# Patient Record
Sex: Female | Born: 1940 | Race: White | Hispanic: No | Smoking: Never smoker
Health system: Southern US, Community
[De-identification: ages and names within clinical notes are randomized; demographics above are authoritative.]

## PROBLEM LIST (undated history)

## (undated) DIAGNOSIS — J189 Pneumonia, unspecified organism: Secondary | ICD-10-CM

## (undated) DIAGNOSIS — J45909 Unspecified asthma, uncomplicated: Secondary | ICD-10-CM

## (undated) DIAGNOSIS — K219 Gastro-esophageal reflux disease without esophagitis: Secondary | ICD-10-CM

## (undated) DIAGNOSIS — R519 Headache, unspecified: Secondary | ICD-10-CM

## (undated) DIAGNOSIS — I10 Essential (primary) hypertension: Secondary | ICD-10-CM

## (undated) DIAGNOSIS — E079 Disorder of thyroid, unspecified: Secondary | ICD-10-CM

## (undated) DIAGNOSIS — D649 Anemia, unspecified: Secondary | ICD-10-CM

## (undated) DIAGNOSIS — M1990 Unspecified osteoarthritis, unspecified site: Secondary | ICD-10-CM

## (undated) DIAGNOSIS — L509 Urticaria, unspecified: Secondary | ICD-10-CM

## (undated) DIAGNOSIS — N189 Chronic kidney disease, unspecified: Secondary | ICD-10-CM

## (undated) DIAGNOSIS — F32A Depression, unspecified: Secondary | ICD-10-CM

## (undated) DIAGNOSIS — T783XXA Angioneurotic edema, initial encounter: Secondary | ICD-10-CM

## (undated) DIAGNOSIS — E039 Hypothyroidism, unspecified: Secondary | ICD-10-CM

## (undated) DIAGNOSIS — F419 Anxiety disorder, unspecified: Secondary | ICD-10-CM

## (undated) DIAGNOSIS — K279 Peptic ulcer, site unspecified, unspecified as acute or chronic, without hemorrhage or perforation: Secondary | ICD-10-CM

## (undated) HISTORY — DX: Angioneurotic edema, initial encounter: T78.3XXA

## (undated) HISTORY — DX: Peptic ulcer, site unspecified, unspecified as acute or chronic, without hemorrhage or perforation: K27.9

## (undated) HISTORY — PX: SINOSCOPY: SHX187

## (undated) HISTORY — DX: Urticaria, unspecified: L50.9

## (undated) HISTORY — PX: ABDOMINAL HYSTERECTOMY: SHX81

## (undated) HISTORY — PX: CATARACT EXTRACTION: SUR2

---

## 2011-03-16 DIAGNOSIS — E039 Hypothyroidism, unspecified: Secondary | ICD-10-CM | POA: Diagnosis present

## 2019-01-14 ENCOUNTER — Other Ambulatory Visit: Payer: Self-pay

## 2019-01-14 ENCOUNTER — Emergency Department (HOSPITAL_COMMUNITY): Payer: Medicare Other

## 2019-01-14 ENCOUNTER — Emergency Department (HOSPITAL_COMMUNITY)
Admission: EM | Admit: 2019-01-14 | Discharge: 2019-01-14 | Disposition: A | Discharge status: Home / Self Care | Payer: Medicare Other | Attending: Emergency Medicine | Admitting: Emergency Medicine

## 2019-01-14 ENCOUNTER — Encounter (HOSPITAL_COMMUNITY): Payer: Self-pay | Admitting: Emergency Medicine

## 2019-01-14 DIAGNOSIS — Y939 Activity, unspecified: Secondary | ICD-10-CM | POA: Insufficient documentation

## 2019-01-14 DIAGNOSIS — Z23 Encounter for immunization: Secondary | ICD-10-CM | POA: Diagnosis not present

## 2019-01-14 DIAGNOSIS — W01198A Fall on same level from slipping, tripping and stumbling with subsequent striking against other object, initial encounter: Secondary | ICD-10-CM | POA: Insufficient documentation

## 2019-01-14 DIAGNOSIS — J45909 Unspecified asthma, uncomplicated: Secondary | ICD-10-CM | POA: Diagnosis not present

## 2019-01-14 DIAGNOSIS — S50811A Abrasion of right forearm, initial encounter: Secondary | ICD-10-CM | POA: Insufficient documentation

## 2019-01-14 DIAGNOSIS — S0990XA Unspecified injury of head, initial encounter: Secondary | ICD-10-CM | POA: Diagnosis present

## 2019-01-14 DIAGNOSIS — S80211A Abrasion, right knee, initial encounter: Secondary | ICD-10-CM | POA: Diagnosis not present

## 2019-01-14 DIAGNOSIS — Y929 Unspecified place or not applicable: Secondary | ICD-10-CM | POA: Insufficient documentation

## 2019-01-14 DIAGNOSIS — I10 Essential (primary) hypertension: Secondary | ICD-10-CM | POA: Diagnosis not present

## 2019-01-14 DIAGNOSIS — Y999 Unspecified external cause status: Secondary | ICD-10-CM | POA: Insufficient documentation

## 2019-01-14 HISTORY — DX: Essential (primary) hypertension: I10

## 2019-01-14 HISTORY — DX: Unspecified asthma, uncomplicated: J45.909

## 2019-01-14 HISTORY — DX: Disorder of thyroid, unspecified: E07.9

## 2019-01-14 MED ORDER — FENTANYL CITRATE (PF) 100 MCG/2ML IJ SOLN
100.0000 ug | Freq: Once | INTRAMUSCULAR | Status: AC
  Administered 2019-01-14: 100 ug via INTRAVENOUS
  Filled 2019-01-14: qty 2

## 2019-01-14 MED ORDER — ONDANSETRON HCL 4 MG/2ML IJ SOLN
4.0000 mg | Freq: Once | INTRAMUSCULAR | Status: AC
  Administered 2019-01-14: 4 mg via INTRAVENOUS
  Filled 2019-01-14: qty 2

## 2019-01-14 MED ORDER — TETANUS-DIPHTH-ACELL PERTUSSIS 5-2.5-18.5 LF-MCG/0.5 IM SUSP
0.5000 mL | Freq: Once | INTRAMUSCULAR | Status: AC
  Administered 2019-01-14: 0.5 mL via INTRAMUSCULAR
  Filled 2019-01-14: qty 0.5

## 2019-01-14 NOTE — ED Provider Notes (Signed)
[facility]-EMERGENCY DEPT Provider Note   CSN: [id number] Arrival date & time: 01/14/19  1156    History   Chief Complaint Chief Complaint  Patient presents with  . Fall  . Headache  . Head Injury    HPI Kevia K Jomarie LongsJoseph is a 78 y.o. female.     HPI 78 year old female on no anticoagulants who presents the emergency department with her closed head injury.  She states she slipped and fell and struck her left forehead on a brick pillar.  She scraped her right knee as well.  She also scraped her anterior distal right forearm.  No pain with range of motion of her major joints.  Ambulatory.  Nausea and headache at this time.  Mild neck pain without weakness of her arms or legs.  No chest pain.  No abdominal pain.  Symptoms are moderate in severity.  She is vomiting on my arrival to the room.  Hematoma to the left forehead noted with associated abrasion   Past Medical History:  Diagnosis Date  . Asthma   . Hypertension   . Thyroid disease     There are no active problems to display for this patient.   Past Surgical History:  Procedure Laterality Date  . ABDOMINAL HYSTERECTOMY       OB History   No obstetric history on file.      Home Medications    Prior to Admission medications   Not on File    Family History No family history on file.  Social History Social History   Tobacco Use  . Smoking status: Never Smoker  Substance Use Topics  . Alcohol use: Not Currently  . Drug use: Not on file     Allergies   Morphine and related; Other; Propofol; and Sulfa antibiotics   Review of Systems Review of Systems  All other systems reviewed and are negative.    Physical Exam Updated Vital Signs BP (!) 149/89   Pulse 78   Temp (!) 97 F (36.1 C) (Axillary)   Resp 20   SpO2 95%   Physical Exam Vitals signs and nursing note reviewed.  Constitutional:      General: She is not in acute distress.    Appearance: She is well-developed.   HENT:     Head: Normocephalic.     Comments: Left forehead hematoma with associated abrasion.  No laceration.  No active bleeding. Neck:     Musculoskeletal: Normal range of motion.     Comments: Mild cervical and paracervical tenderness without cervical step-off. Cardiovascular:     Rate and Rhythm: Normal rate and regular rhythm.     Heart sounds: Normal heart sounds.  Pulmonary:     Effort: Pulmonary effort is normal.     Breath sounds: Normal breath sounds.  Abdominal:     General: There is no distension.     Palpations: Abdomen is soft.     Tenderness: There is no abdominal tenderness.  Musculoskeletal: Normal range of motion.     Comments: Abrasion right anterior forearm.  Abrasion right anterior knee.  Full range of motion of bilateral hips, knees, ankles.  Full range of motion of bilateral shoulders, elbows, wrist.  Skin:    General: Skin is warm and dry.  Neurological:     Mental Status: She is alert and oriented to person, place, and time.  Psychiatric:        Judgment: Judgment normal.      ED Treatments / Results  Labs (all labs ordered are listed, but only abnormal results are displayed) Labs Reviewed - No data to display  EKG None  Radiology Ct Head Wo Contrast  Result Date: 01/14/2019 CLINICAL DATA:  Larey Seat EXAM: CT HEAD WITHOUT CONTRAST CT CERVICAL SPINE WITHOUT CONTRAST TECHNIQUE: Multidetector CT imaging of the head and cervical spine was performed following the standard protocol without intravenous contrast. Multiplanar CT image reconstructions of the cervical spine were also generated. COMPARISON:  None. FINDINGS: CT HEAD FINDINGS Brain: Diffuse parenchymal atrophy. Patchy areas of hypoattenuation in deep and periventricular white matter bilaterally. Negative for acute intracranial hemorrhage, mass lesion, acute infarction, midline shift, or mass-effect. Acute infarct may be inapparent on noncontrast CT. Ventricles and sulci symmetric. Vascular: No hyperdense  vessel or unexpected calcification. Skull: Normal. Negative for fracture or focal lesion. Sinuses/Orbits: No acute finding. Other: Left frontal scalp hematoma. CT CERVICAL SPINE FINDINGS Alignment: Normal with straightening of the lordosis Skull base and vertebrae: Negative for fracture focal bone lesion. Soft tissues and spinal canal: No prevertebral fluid or swelling. No visible canal hematoma. Disc levels: Right facet DJD C4-5 with 2 mm anterolisthesis and mild interspace narrowing. Moderate narrowing of C5-6 and C6-7 interspaces with small endplate spurs. Upper chest: Negative. Other: None IMPRESSION: 1. Negative for bleed or other acute intracranial process. 2. Atrophy and nonspecific white matter changes. 3. Negative for cervical fracture or other acute bone abnormality. 4. Multilevel cervical spondylitic changes as above. 5. Loss of the normal cervical spine lordosis, which may be secondary to positioning, spasm, or soft tissue injury. Electronically Signed   By: Corlis Leak M.D.   On: 01/14/2019 14:44   Ct Cervical Spine Wo Contrast  Result Date: 01/14/2019 CLINICAL DATA:  Larey Seat EXAM: CT HEAD WITHOUT CONTRAST CT CERVICAL SPINE WITHOUT CONTRAST TECHNIQUE: Multidetector CT imaging of the head and cervical spine was performed following the standard protocol without intravenous contrast. Multiplanar CT image reconstructions of the cervical spine were also generated. COMPARISON:  None. FINDINGS: CT HEAD FINDINGS Brain: Diffuse parenchymal atrophy. Patchy areas of hypoattenuation in deep and periventricular white matter bilaterally. Negative for acute intracranial hemorrhage, mass lesion, acute infarction, midline shift, or mass-effect. Acute infarct may be inapparent on noncontrast CT. Ventricles and sulci symmetric. Vascular: No hyperdense vessel or unexpected calcification. Skull: Normal. Negative for fracture or focal lesion. Sinuses/Orbits: No acute finding. Other: Left frontal scalp hematoma. CT CERVICAL  SPINE FINDINGS Alignment: Normal with straightening of the lordosis Skull base and vertebrae: Negative for fracture focal bone lesion. Soft tissues and spinal canal: No prevertebral fluid or swelling. No visible canal hematoma. Disc levels: Right facet DJD C4-5 with 2 mm anterolisthesis and mild interspace narrowing. Moderate narrowing of C5-6 and C6-7 interspaces with small endplate spurs. Upper chest: Negative. Other: None IMPRESSION: 1. Negative for bleed or other acute intracranial process. 2. Atrophy and nonspecific white matter changes. 3. Negative for cervical fracture or other acute bone abnormality. 4. Multilevel cervical spondylitic changes as above. 5. Loss of the normal cervical spine lordosis, which may be secondary to positioning, spasm, or soft tissue injury. Electronically Signed   By: Corlis Leak M.D.   On: 01/14/2019 14:44    Procedures Procedures (including critical care time)  Medications Ordered in ED Medications  ondansetron (ZOFRAN) injection 4 mg (4 mg Intravenous Given 01/14/19 1315)  fentaNYL (SUBLIMAZE) injection 100 mcg (100 mcg Intravenous Given 01/14/19 1315)  Tdap (BOOSTRIX) injection 0.5 mL (0.5 mLs Intramuscular Given 01/14/19 1316)     Initial Impression / Assessment and Plan /  ED Course  I have reviewed the triage vital signs and the nursing notes.  Pertinent labs & imaging results that were available during my care of the patient were reviewed by me and considered in my medical decision making (see chart for details).        CT imaging of the head and neck without acute traumatic findings.  Abrasions to the right forearm and right knee with full range of motion of major surrounding joints.  No indication for plain films.  Feels much better after treatment here in the emergency department.  Close head injury warnings given.  Discussed discharge instructions with her grandson Nida Boatman including instructions regarding delayed head bleed and close observation at home.  The  patient is currently staying with her grandson here in [place].   Final Clinical Impressions(s) / ED Diagnoses   Final diagnoses:  Closed head injury, initial encounter    ED Discharge Orders    None       Azalia Bilis, MD 01/14/19 1549

## 2019-01-14 NOTE — ED Notes (Signed)
Bed: WA02 Expected date:  Expected time:  Means of arrival:  Comments: 

## 2019-01-14 NOTE — ED Triage Notes (Signed)
Pt fell outside of grocery store hitting head and arm on brick column. Pt with contusion and swelling to L forehead, abrasion to R forearm and R knee. Pt denies LOC. Pt also c/o nausea / vomiting since the fall.

## 2019-11-08 DIAGNOSIS — E785 Hyperlipidemia, unspecified: Secondary | ICD-10-CM | POA: Insufficient documentation

## 2019-11-08 DIAGNOSIS — I10 Essential (primary) hypertension: Secondary | ICD-10-CM | POA: Diagnosis present

## 2019-11-08 DIAGNOSIS — G4733 Obstructive sleep apnea (adult) (pediatric): Secondary | ICD-10-CM | POA: Diagnosis present

## 2019-11-16 ENCOUNTER — Ambulatory Visit (INDEPENDENT_AMBULATORY_CARE_PROVIDER_SITE_OTHER): Payer: Medicare Other

## 2019-11-16 ENCOUNTER — Encounter: Payer: Self-pay | Admitting: Internal Medicine

## 2019-11-16 ENCOUNTER — Ambulatory Visit: Payer: Medicare Other | Admitting: Internal Medicine

## 2019-11-16 ENCOUNTER — Other Ambulatory Visit: Payer: Self-pay

## 2019-11-16 DIAGNOSIS — J45991 Cough variant asthma: Secondary | ICD-10-CM | POA: Insufficient documentation

## 2019-11-16 MED ORDER — BUDESONIDE-FORMOTEROL FUMARATE 80-4.5 MCG/ACT IN AERO: INHALATION_SPRAY | RESPIRATORY_TRACT | 12 refills | Status: DC

## 2019-11-16 MED ORDER — FAMOTIDINE 20 MG PO TABS: ORAL_TABLET | ORAL | 11 refills | Status: DC

## 2019-11-16 MED ORDER — PREDNISONE 10 MG PO TABS: ORAL_TABLET | ORAL | 5 refills | Status: DC

## 2019-11-16 NOTE — Progress Notes (Signed)
Jeanne Ortiz, female    DOB: 1941-06-30    MRN: [id number]   Brief patient profile:  79 yowf never smoker onset in teenager years in [place] with intermittent non-seasonal rhinitis more problem around age 79 with year round symptoms no better with sinus surgeries / allergy eval pos birch, horses cats/dog never took shots  with new onset cough/wheezing  around 2010 eval by [facility] rx alb/antihistamines then he left for [facility]  and then seen  By [facility]  rec gerd rx didn't help and side effects = crampy  then nucala did not take due to cost and worse cough/sob since arrived in [facility] March 2020 so referred to pulmonary clinic 11/16/2019 by [name]  NP.  Living in house built in 1980s with house with crawl space/ 2 cats / golden retriever    History of Present Illness  11/16/2019  Pulmonary/ 1st office eval/Jeanne Ortiz / maint on singulair and generic advair Chief Complaint  Patient presents with  . Pulmonary Consult    Referred by [name], NP. Pt c/o cough for the past 8-10 years.   Dyspnea:  MMRC2 = can't walk a nl pace on a flat grade s sob but does fine slow and flat leaning on cart at HT Cough: dry raspy after supper and hs  Sleep: flat bed 2 pillows  SABA use: daily now but up to 7x per day much less since last prednisone finished  4 days  prior to OV   Has overt HB but told can't take ppi due to renal dz  No obvious day to day or daytime variability or assoc excess/ purulent sputum or mucus plugs or hemoptysis or cp or chest tightness, subjective wheeze or overt sinus or hb symptoms.    Also denies any obvious fluctuation of symptoms with weather or environmental changes or other aggravating or alleviating factors except as outlined above   No unusual exposure hx or h/o childhood pna/ asthma or knowledge of premature birth.  Current Allergies, Complete Past Medical History, Past Surgical History, Family History, and Social History were  reviewed in [employer] record.  ROS  The following are not active complaints unless bolded Hoarseness, sore throat, dysphagia, dental problems, itching, sneezing,  nasal congestion or discharge of excess mucus or purulent secretions, ear ache,   fever, chills, sweats, unintended wt loss or wt gain, classically pleuritic or exertional cp,  orthopnea pnd or arm/hand swelling  or leg swelling, presyncope, palpitations, abdominal pain, anorexia, nausea, vomiting, diarrhea  or change in bowel habits or change in bladder habits, change in stools or change in urine, dysuria, hematuria,  rash, arthralgias, visual complaints, headache, numbness, weakness or ataxia or problems with walking or coordination,  change in mood or  memory.           Past Medical History:  Diagnosis Date  . Asthma   . Hypertension   . Thyroid disease     Outpatient Medications Prior to Visit  Medication Sig Dispense Refill  . Albuterol Sulfate (PROAIR HFA IN) Inhale 2 puffs into the lungs every 4 (four) hours as needed.    Marland Kitchen azelastine (ASTELIN) 0.1 % nasal spray Place into both nostrils 2 (two) times daily. Use in each nostril as directed    . b complex vitamins tablet Take 1 tablet by mouth daily.    . cholecalciferol (VITAMIN D3) 25 MCG (1000 UNIT) tablet Take 1,000 Units by mouth daily.    . Coenzyme Q10  200 MG capsule Take 200 mg by mouth daily.    . Docosahexaenoic Acid (DHA) 200 MG CAPS Take by mouth.    . fexofenadine (ALLEGRA) 180 MG tablet Take 180 mg by mouth daily.    . Fluticasone-Salmeterol (WIXELA INHUB) 100-50 MCG/DOSE AEPB Inhale 1 puff into the lungs 2 (two) times daily as needed.    Marland Kitchen guaiFENesin (MUCINEX) 600 MG 12 hr tablet Take 600 mg by mouth 2 (two) times daily as needed.    Marland Kitchen ibuprofen (ADVIL) 200 MG tablet Take 200 mg by mouth every 6 (six) hours as needed.    Marland Kitchen ipratropium (ATROVENT) 0.02 % nebulizer solution Take 0.5 mg by nebulization every 4 (four) hours as needed for  wheezing or shortness of breath.    . levothyroxine (SYNTHROID) 112 MCG tablet Take 112 mcg by mouth daily before breakfast.    . liraglutide (VICTOZA) 18 MG/3ML SOPN Inject into the skin.    Marland Kitchen LORazepam (ATIVAN) 0.5 MG tablet Take 0.5-1 mg by mouth at bedtime.    Marland Kitchen losartan (COZAAR) 50 MG tablet Take 50 mg by mouth daily.    . magnesium oxide (MAG-OX) 400 MG tablet Take 400 mg by mouth daily.    . montelukast (SINGULAIR) 10 MG tablet Take 10 mg by mouth at bedtime.    . Multiple Vitamins-Minerals (ZINC PO) Take 1 tablet by mouth daily.    . naproxen sodium (ALEVE) 220 MG tablet Take 220 mg by mouth daily as needed.    . pseudoephedrine (SUDAFED) 30 MG tablet Take 30 mg by mouth every 4 (four) hours as needed for congestion.    . simethicone (MYLICON) 643 MG chewable tablet Chew 125 mg by mouth every 6 (six) hours as needed for flatulence.    . sodium chloride (OCEAN) 0.65 % SOLN nasal spray Place 1 spray into both nostrils as needed for congestion.    Marland Kitchen spironolactone (ALDACTONE) 25 MG tablet Take 25 mg by mouth daily.    Marland Kitchen venlafaxine (EFFEXOR) 25 MG tablet Take 25 mg by mouth daily.    . vitamin E (VITAMIN E) 180 MG (400 UNITS) capsule Take 400 Units by mouth daily.        Objective:     BP (!) 114/56 (BP Location: Left Arm, Cuff Size: Normal)   Pulse 81   Temp 98.7 F (37.1 C) (Temporal)   Ht 5' 2.5" (1.588 m)   Wt 220 lb (99.8 kg)   SpO2 96% Comment: on RA  BMI 39.60 kg/m   SpO2: 96 %(on RA)   amb obese wf  nad    HEENT : pt wearing mask not removed for exam due to covid -19 concerns.    NECK :  without JVD/Nodes/TM/ nl carotid upstrokes bilaterally   LUNGS: no acc muscle use,  Nl contour chest which is clear to A and P bilaterally     CV:  RRR  no s3 or murmur or increase in P2, and no edema   ABD:  Obese soft and nontender with nl inspiratory excursion in the supine position. No bruits or organomegaly appreciated, bowel sounds nl  MS:  Nl gait/ ext warm without  deformities, calf tenderness, cyanosis or clubbing No obvious joint restrictions   SKIN: warm and dry without lesions    NEURO:  alert, approp, nl sensorium with  no motor or cerebellar deficits apparent.    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 No acute cardiopulmonary abnormality.      Assessment   Cough variant asthma Onset around 2010 in pt with allergic rhinitis - 11/16/2019  After extensive coaching inhaler device,  effectiveness =    75% > try symbicort 80 2bid instead of wixella (DPI)  DDX of  difficult airways management almost all start with A and  include Adherence, Ace Inhibitors, Acid Reflux, Active Sinus Disease, Alpha 1 Antitripsin deficiency, Anxiety masquerading as Airways dz,  ABPA,  Allergy(esp in young), Aspiration (esp in elderly), Adverse effects of meds,  Active smoking or vaping, A bunch of PE's (a small clot burden can't cause this syndrome unless there is already severe underlying pulm or vascular dz with poor reserve) plus two Bs  = Bronchiectasis and Beta blocker use..and one C= CHF   Adherence is always the initial "prime suspect" and is a multilayered concern that requires a "trust but verify" approach in every patient - starting with knowing how to use medications, especially inhalers, correctly, keeping up with refills and understanding the fundamental difference between maintenance and prns vs those medications only taken for a very short course and then stopped and not refilled.  - see hfa teaching  - return with all meds in hand using a trust but verify approach to confirm accurate Medication  Reconciliation The principal here is that until we are certain that the  patients are doing what we've asked, it makes no sense to ask them to do more.   ? Allergy > just  finished pred so hold off on profile for now and should do fine on singulair/ low dose symbicort but use pred as plan D   ? ABPA > check IgE   ? Acid (or non-acid) GERD > always difficult to exclude as up to 75% of pts in some series report no assoc GI/ Heartburn symptoms> rec pepcid 20 mg bid  suppression and diet restrictions/ reviewed and instructions given in writing.   ? Adverse effects of meds > d/c dpi which tend to cause more cough than hfa  ? Active sinus dz > s/p 2 "unsuccessful" sinus surgeries so keep in ddz/ consider sinus CT if not responding to above rx   I had an extended discussion with the patient and reviewed all relevant studies so total time was 60  Minutes for visit/ charting with high  level of MDM to address chronic refractory symptoms and teach hfa using teach back method  Each maintenance medication was reviewed in detail including most importantly the difference between maintenance and prns and under what circumstances the prns are to be triggered using an action plan format that is not reflected in the computer generated alphabetically organized AVS.     Please see AVS for specific instructions unique to this visit that I personally wrote and verbalized to the the pt in detail and then reviewed with pt  by my nurse highlighting any  changes in therapy recommended at today's visit to their plan of care.       Sandrea Hughs, MD 11/16/2019

## 2019-11-16 NOTE — Patient Instructions (Addendum)
.  Plan A = Automatic = Always=    Symbicort 80 Take 2 puffs first thing in am and then another 2 puffs about 12 hours later.   Work on inhaler technique:  relax and gently blow all the way out then take a nice smooth deep breath back in, triggering the inhaler at same time you start breathing in.  Hold for up to 5 seconds if you can. Blow out thru nose. Rinse and gargle with water when done  Pepcid 20 mg one after bfast and supper   Plan B = Backup (to supplement plan A, not to replace it) Only use your albuterol(poair)  inhaler as a rescue medication to be used if you can't catch your breath by resting or doing a relaxed purse lip breathing pattern.  - The less you use it, the better it will work when you need it. - Ok to use the inhaler up to 2 puffs  every 4 hours if you must but call for appointment if use goes up over your usual need - Don't leave home without it !!  (think of it like the spare tire for your car)   Plan C = Crisis (instead of Plan B but only if Plan B stops working) - only use your albuterol nebulizer if you first try Plan B and it fails to help > ok to use the nebulizer up to every 4 hours but if start needing it regularly call for immediate appointment   Plan D = Deltasone - if getting worse despite above then take Prednisone 10 mg take  4 each am x 2 days,   2 each am x 2 days,  1 each am x 2 days and stop    Please remember to go to the  x-ray department  for your tests - we will call you with the results when they are available    Please schedule a follow up office visit in 6 weeks, call sooner if needed

## 2019-11-17 ENCOUNTER — Encounter: Payer: Self-pay | Admitting: Internal Medicine

## 2019-11-17 NOTE — Assessment & Plan Note (Addendum)
Onset around 2010 in pt with allergic rhinitis - 11/16/2019  After extensive coaching inhaler device,  effectiveness =    75% > try symbicort 80 2bid instead of wixella (DPI)  DDX of  difficult airways management almost all start with A and  include Adherence, Ace Inhibitors, Acid Reflux, Active Sinus Disease, Alpha 1 Antitripsin deficiency, Anxiety masquerading as Airways dz,  ABPA,  Allergy(esp in young), Aspiration (esp in elderly), Adverse effects of meds,  Active smoking or vaping, A bunch of PE's (a small clot burden can't cause this syndrome unless there is already severe underlying pulm or vascular dz with poor reserve) plus two Bs  = Bronchiectasis and Beta blocker use..and one C= CHF   Adherence is always the initial "prime suspect" and is a multilayered concern that requires a "trust but verify" approach in every patient - starting with knowing how to use medications, especially inhalers, correctly, keeping up with refills and understanding the fundamental difference between maintenance and prns vs those medications only taken for a very short course and then stopped and not refilled.  - see hfa teaching  - return with all meds in hand using a trust but verify approach to confirm accurate Medication  Reconciliation The principal here is that until we are certain that the  patients are doing what we've asked, it makes no sense to ask them to do more.   ? Allergy > just finished pred so hold off on profile for now and should do fine on singulair/ low dose symbicort but use pred as plan D   ? ABPA > check IgE   ? Acid (or non-acid) GERD > always difficult to exclude as up to 75% of pts in some series report no assoc GI/ Heartburn symptoms> rec pepcid 20 mg bid  suppression and diet restrictions/ reviewed and instructions given in writing.   ? Adverse effects of meds > d/c dpi which tend to cause more cough than hfa  ? Active sinus dz > s/p 2 "unsuccessful" sinus surgeries so keep in ddz/  consider sinus CT if not responding to above rx   I had an extended discussion with the patient and reviewed all relevant studies so total time was 60  Minutes for visit/ charting with high  level of MDM to address chronic refractory symptoms and teach hfa using teach back method  Each maintenance medication was reviewed in detail including most importantly the difference between maintenance and prns and under what circumstances the prns are to be triggered using an action plan format that is not reflected in the computer generated alphabetically organized AVS.     Please see AVS for specific instructions unique to this visit that I personally wrote and verbalized to the the pt in detail and then reviewed with pt  by my nurse highlighting any  changes in therapy recommended at today's visit to their plan of care.

## 2019-12-28 ENCOUNTER — Ambulatory Visit: Payer: Medicare Other | Admitting: Internal Medicine

## 2020-01-26 ENCOUNTER — Ambulatory Visit: Payer: Medicare Other | Admitting: Internal Medicine

## 2020-01-26 ENCOUNTER — Other Ambulatory Visit: Payer: Self-pay

## 2020-01-26 ENCOUNTER — Encounter: Payer: Self-pay | Admitting: Internal Medicine

## 2020-01-26 VITALS — BP 118/68 | HR 72 | Temp 98.3°F | Ht 62.5 in | Wt 217.0 lb

## 2020-01-26 DIAGNOSIS — J45991 Cough variant asthma: Secondary | ICD-10-CM

## 2020-01-26 NOTE — Patient Instructions (Signed)
No change in therapy    Please schedule a follow up visit in 6 months but call sooner if needed with PFTs on return

## 2020-01-26 NOTE — Progress Notes (Signed)
Jeanne Ortiz, female    DOB: 07-23-1941    MRN: [id number]   Brief patient profile:  79 yowf never smoker onset in teenager years in [place] with intermittent non-seasonal rhinitis more problem around age 79 with year round symptoms no better with sinus surgeries / allergy eval pos birch, horses cats/dog never took shots  with new onset cough/wheezing  around 2010 eval by [facility] rx alb/antihistamines then he left for [place] [facility]  and then seen  By [facility]  rec gerd rx didn't help and side effects = crampy  then nucala did not take due to cost and worse cough/sob since arrived in [facility] March 2020 so referred to pulmonary clinic 11/16/2019 by [name]  NP.  Living in house built in 1980s with house with crawl space/ 2 cats / golden retriever    History of Present Illness  11/16/2019  Pulmonary/ 1st office eval/Jeanne Ortiz / maint on singulair and generic advair Chief Complaint  Patient presents with  . Pulmonary Consult    Referred by [name], NP. Pt c/o cough for the past 8-10 years.   Dyspnea:  MMRC2 = can't walk a nl pace on a flat grade s sob but does fine slow and flat leaning on cart at HT Cough: dry raspy after supper and hs  Sleep: flat bed 2 pillows  SABA use: daily now but up to 7x per day much less since last prednisone finished  4 days  prior to OV   Has overt HB but told can't take ppi due to renal dz rec .Plan A = Automatic = Always=    Symbicort 80 Take 2 puffs first thing in am and then another 2 puffs about 12 hours later.  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 Follow-up    Cough has improved. Only has occ cough now- non prod. She rarely uses her albuterol inhaler or neb.   Dyspnea:  Pushing stroller limited by knees not sob Cough: dry, sporadic much improved off advair and on symb 80 Sleeping: fine bed is flat 2 pillows  SABA use: rarely 02: none    No obvious day to day or daytime variability or assoc excess/ purulent sputum or mucus plugs or hemoptysis or cp or chest tightness, subjective wheeze or overt sinus or hb symptoms.   Sleeping  without nocturnal  or early am exacerbation  of respiratory  c/o's or need for noct saba. Also denies any obvious fluctuation of symptoms with weather or environmental changes or other aggravating or alleviating factors except as outlined above   No unusual exposure hx or h/o childhood pna/ asthma or knowledge of premature birth.  Current Allergies, Complete Past Medical History, Past Surgical History, Family History, and Social History  were reviewed in [facility] record.  ROS  The following are not active complaints unless bolded Hoarseness, sore throat, dysphagia, dental problems, itching, sneezing,  nasal congestion or discharge of excess mucus or purulent secretions, ear ache,   fever, chills, sweats, unintended wt loss or wt gain, classically pleuritic or exertional cp,  orthopnea pnd or arm/hand swelling  or leg swelling, presyncope, palpitations, abdominal pain, anorexia, nausea, vomiting, diarrhea  or change in bowel habits or change in bladder habits, change in stools or change in urine, dysuria, hematuria,  rash, arthralgias, visual complaints, headache, numbness, weakness or ataxia or problems with walking or coordination,  change in mood or  memory.        Current Meds  Medication Sig  .  Albuterol Sulfate (PROAIR HFA IN) Inhale 2 puffs into the lungs every 4 (four) hours as needed.  Marland Kitchen azelastine (ASTELIN) 0.1 % nasal spray Place into both nostrils 2 (two) times daily. Use in each nostril as directed  . b complex vitamins tablet Take 1 tablet by mouth daily.  . budesonide-formoterol (SYMBICORT) 80-4.5 MCG/ACT inhaler Take 2 puffs first thing in am and then another 2 puffs about 12 hours later.  . cholecalciferol (VITAMIN D3) 25 MCG (1000 UNIT) tablet Take 1,000 Units by mouth daily.  . Coenzyme Q10 200 MG capsule Take 200 mg by mouth daily.  . Docosahexaenoic Acid (DHA) 200 MG CAPS Take by mouth.  . famotidine (PEPCID) 20 MG tablet One after bfast and one  after supper  . guaiFENesin (MUCINEX) 600 MG 12 hr tablet Take 600 mg by mouth 2 (two) times daily as needed.  Marland Kitchen ibuprofen (ADVIL) 200 MG tablet Take 200 mg by mouth every 6 (six) hours as needed.  Marland Kitchen ipratropium (ATROVENT) 0.02 % nebulizer solution Take 0.5 mg by nebulization every 4 (four) hours as needed for wheezing or shortness of breath.  . levothyroxine (SYNTHROID) 112 MCG tablet Take 112 mcg by mouth daily before breakfast.  . liraglutide (VICTOZA) 18 MG/3ML SOPN Inject into the skin.  Marland Kitchen loratadine-pseudoephedrine (CLARITIN-D 24-HOUR) 10-240 MG 24 hr tablet Take 1 tablet by mouth daily.  Marland Kitchen LORazepam (ATIVAN) 0.5 MG tablet Take 0.5-1 mg by mouth at bedtime.  Marland Kitchen losartan (COZAAR) 50 MG tablet Take 50 mg by mouth daily.  . magnesium oxide (MAG-OX) 400 MG tablet Take 400 mg by mouth daily.  . montelukast (SINGULAIR) 10 MG tablet Take 10 mg by mouth at bedtime.  . Multiple Vitamins-Minerals (ZINC PO) Take 1 tablet by mouth daily.  . naproxen sodium (ALEVE) 220 MG tablet Take 220 mg by mouth daily as needed.  . pseudoephedrine (SUDAFED) 30 MG tablet Take 30 mg by mouth every 4 (four) hours as needed for congestion.  . simethicone (MYLICON) 829 MG chewable tablet Chew 125 mg by mouth every 6 (six) hours as needed for flatulence.   . sodium chloride (OCEAN) 0.65 % SOLN nasal spray Place 1 spray into both nostrils as needed for congestion.  Marland Kitchen spironolactone (ALDACTONE) 25 MG tablet Take 25 mg by mouth daily.  Marland Kitchen venlafaxine (EFFEXOR) 25 MG tablet Take 25 mg by mouth daily.  . vitamin E (VITAMIN E) 180 MG (400 UNITS) capsule Take 400 Units by mouth daily.                Past Medical History:  Diagnosis Date  . Asthma   . Hypertension   . Thyroid disease        Objective:  Wt Readings from Last 3 Encounters:  01/26/20 217 lb (98.4 kg)  11/16/19 220 lb (99.8 kg)     Vital signs reviewed  01/26/2020  - Note at rest 02 sats  98% on RA    HEENT : pt wearing mask not removed for exam due to covid -19 concerns.    NECK :  without JVD/Nodes/TM/ nl carotid upstrokes bilaterally   LUNGS: no acc muscle use,  Nl contour chest which is clear to A and P bilaterally without cough on insp or exp maneuvers   CV:  RRR  no s3 or murmur or increase in P2, and no edema   ABD:  soft and nontender with nl inspiratory excursion in the supine position. No bruits or organomegaly appreciated, bowel sounds nl  MS:  Nl gait/ ext warm without deformities, calf tenderness, cyanosis or clubbing No obvious joint restrictions   SKIN: warm and dry without lesions    NEURO:  alert, approp, nl sensorium with  no motor or cerebellar deficits apparent.        Assessment

## 2020-01-28 NOTE — Assessment & Plan Note (Signed)
Onset around 2010 in pt with allergic rhinitis - 11/16/2019   try symbicort 80 2bid instead of wixella (DPI) > much better - 01/26/2020  After extensive coaching inhaler device,  effectiveness =    90% from baseline 75%  Markedly improved at this point All goals of chronic asthma control met including optimal function and elimination of symptoms with minimal need for rescue therapy.  Contingencies discussed in full including contacting this office immediately if not controlling the symptoms using the rule of two's.      F/u in 6 m with pfts to address issue of ACOS.

## 2020-01-30 ENCOUNTER — Telehealth: Payer: Self-pay | Admitting: Internal Medicine

## 2020-01-30 MED ORDER — MONTELUKAST SODIUM 10 MG PO TABS: 10.0000 mg | ORAL_TABLET | Freq: Every day | ORAL | 3 refills | Status: DC

## 2020-01-30 NOTE — Telephone Encounter (Signed)
Pt returning a phone call. Pt can be reached at [phone]

## 2020-01-30 NOTE — Telephone Encounter (Signed)
LMTCB x1 for pt.  

## 2020-01-30 NOTE — Telephone Encounter (Signed)
Rx has been sent to preferred pharmacy for pt. Called and spoke with pt letting her know this had been done and she verbalized understanding. Nothing further needed. °

## 2020-04-10 ENCOUNTER — Encounter (HOSPITAL_COMMUNITY): Payer: Self-pay

## 2020-04-26 ENCOUNTER — Other Ambulatory Visit (HOSPITAL_COMMUNITY): Payer: Medicare Other

## 2020-04-29 ENCOUNTER — Ambulatory Visit: Payer: Medicare Other | Admitting: Internal Medicine

## 2020-05-08 ENCOUNTER — Telehealth: Payer: Self-pay | Admitting: Internal Medicine

## 2020-05-08 NOTE — Telephone Encounter (Signed)
Should follow the action plan as written call if needs refill on prednisone as it is Plan D

## 2020-05-08 NOTE — Telephone Encounter (Signed)
Spoke with the pt  She had rhinitis and sinus drainage a wk ago- went to [facility]- placed on Cefdinir, started to improve  She states that then 2 days ago started coughing a lot- prod with yellow to green sputum  She states yesterday she started wheezing- she had pred on hand and took 20 mg last night and 40 mg this am and this has helped  She states that the does feel fatigued- no f/c/s She wanted MW to know what was going on  Has had covid vaccine and tested neg for covid at [facility] a wk ago  She states has enough pred to take as directed as outlined in plan part D as stated below:  .Plan A = Automatic = Always=    Symbicort 80 Take 2 puffs first thing in am and then another 2 puffs about 12 hours later.  Work on inhaler technique:   Pepcid 20 mg one after bfast and supper  Plan B = Backup (to supplement plan A, not to replace it) Only use your albuterol(poair)  inhaler as a rescue medication Plan C = Crisis (instead of Plan B but only if Plan B stops working) - only use your albuterol nebulizer if you first try Plan B  Plan D = Deltasone - if getting worse despite above then take Prednisone 10 mg take  4 each am x 2 days,   2 each am x 2 days,  1 each am x 2 days and stop   Please advise if any further recs

## 2020-05-08 NOTE — Telephone Encounter (Signed)
Attempted to call pt but unable to reach. Left message for her to return call. 

## 2020-05-08 NOTE — Telephone Encounter (Signed)
Spoke with the pt and notified of recs per MW and she verbalized understanding 

## 2020-05-31 ENCOUNTER — Other Ambulatory Visit: Payer: Self-pay

## 2020-05-31 ENCOUNTER — Ambulatory Visit (INDEPENDENT_AMBULATORY_CARE_PROVIDER_SITE_OTHER): Payer: Medicare Other | Admitting: Internal Medicine

## 2020-05-31 ENCOUNTER — Encounter: Payer: Self-pay | Admitting: Internal Medicine

## 2020-05-31 ENCOUNTER — Ambulatory Visit: Payer: Medicare Other | Admitting: Internal Medicine

## 2020-05-31 DIAGNOSIS — J45991 Cough variant asthma: Secondary | ICD-10-CM | POA: Diagnosis not present

## 2020-05-31 DIAGNOSIS — J310 Chronic rhinitis: Secondary | ICD-10-CM | POA: Diagnosis not present

## 2020-05-31 LAB — PULMONARY FUNCTION TEST
DL/VA % pred: 117 %
DL/VA: 4.85 ml/min/mmHg/L
DLCO cor % pred: 135 %
DLCO cor: 23.79 ml/min/mmHg
DLCO unc % pred: 135 %
DLCO unc: 23.79 ml/min/mmHg
FEF 25-75 Post: 2.01 L/sec
FEF 25-75 Pre: 1.53 L/sec
FEF2575-%Change-Post: 30 %
FEF2575-%Pred-Post: 149 %
FEF2575-%Pred-Pre: 113 %
FEV1-%Change-Post: 9 %
FEV1-%Pred-Post: 109 %
FEV1-%Pred-Pre: 100 %
FEV1-Post: 1.96 L
FEV1-Pre: 1.8 L
FEV1FVC-%Change-Post: 2 %
FEV1FVC-%Pred-Pre: 102 %
FEV6-%Change-Post: 7 %
FEV6-%Pred-Post: 110 %
FEV6-%Pred-Pre: 102 %
FEV6-Post: 2.5 L
FEV6-Pre: 2.33 L
FEV6FVC-%Pred-Post: 105 %
FEV6FVC-%Pred-Pre: 105 %
FVC-%Change-Post: 6 %
FVC-%Pred-Post: 103 %
FVC-%Pred-Pre: 97 %
FVC-Post: 2.5 L
FVC-Pre: 2.36 L
Post FEV1/FVC ratio: 78 %
Post FEV6/FVC ratio: 100 %
Pre FEV1/FVC ratio: 76 %
Pre FEV6/FVC Ratio: 100 %
RV % pred: 109 %
RV: 2.49 L
TLC % pred: 107 %
TLC: 5.13 L

## 2020-05-31 MED ORDER — PREDNISONE 10 MG PO TABS: ORAL_TABLET | ORAL | 11 refills | Status: DC

## 2020-05-31 MED ORDER — AZELASTINE-FLUTICASONE 137-50 MCG/ACT NA SUSP: 1.0000 | Freq: Two times a day (BID) | NASAL | 11 refills | Status: DC

## 2020-05-31 NOTE — Progress Notes (Signed)
Jeanne Ortiz, female    DOB: September 21, 1940    MRN: [id number]   Brief patient profile:  79 yowf never smoker onset in teenager years in [place] with intermittent non-seasonal rhinitis more problem around age 79 with year round symptoms no better with sinus surgeries / allergy eval pos birch, horses cats/dog never took shots  with new onset cough/wheezing  around 2010 eval by [facility] rx alb/antihistamines then he left for [facility]  and then seen  By [facility]  rec gerd rx didn't help and side effects = crampy  then nucala did not take due to cost and worse cough/sob since arrived in [facility] March 2020 so referred to pulmonary clinic 11/16/2019 by [name]  NP.  Living in house built in 1980s with house with crawl space/ 2 cats / golden retriever    History of Present Illness  11/16/2019  Pulmonary/ 1st office eval/Herndon Grill / maint on singulair and generic advair Chief Complaint  Patient presents with  . Pulmonary Consult    Referred by [name], NP. Pt c/o cough for the past 8-10 years.   Dyspnea:  MMRC2 = can't walk a nl pace on a flat grade s sob but does fine slow and flat leaning on cart at HT Cough: dry raspy after supper and hs  Sleep: flat bed 2 pillows  SABA use: daily now but up to 7x per day much less since last prednisone finished  4 days  prior to OV   Has overt HB but told can't take ppi due to renal dz rec .Plan A = Automatic = Always=    Symbicort 80 Take 2 puffs first thing in am and then another 2 puffs about 12 hours later.  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 Follow-up    Cough has improved. Only has occ cough now- non prod. She rarely uses her albuterol inhaler or neb.   Dyspnea:  Pushing stroller limited by knees not sob Cough: dry, sporadic much improved off advair and on symb 80 Sleeping: fine bed is flat 2 pillows  SABA use: rarely 02: none   rec No change rx   05/31/2020  f/u ov/Jeanne Ortiz re: asthma  maint on symbicort 80 2bid / singulair/ poor control of pnds on astelin prn and clariton d as maint  Chief Complaint  Patient presents with  . Follow-up    PFT.  Cough has improved. She still has some cough that she relates to PND. She has not had to use her albuterol inhaler.   Dyspnea:  Not limited by breathing from desired activities   Cough: since august sinus infection has sense of pnds but does not typically keep her up  Sleeping: bed flat/ 2 pillows  SABA  use:  None  02: none    No obvious day to day or daytime variability or assoc excess/ purulent sputum or mucus plugs or hemoptysis or cp or chest tightness, subjective wheeze or overt   hb symptoms.   Sleeping  without nocturnal  or early am exacerbation  of respiratory  c/o's or need for noct saba. Also denies any obvious fluctuation of symptoms with weather or environmental changes or other aggravating or alleviating factors except as outlined above   No unusual exposure hx or h/o childhood pna/ asthma or knowledge of premature birth.  Current Allergies, Complete Past Medical History, Past Surgical History, Family History, and Social History were reviewed in [employer] [facility] record.  ROS  The following are not active complaints unless bolded Hoarseness, sore throat, dysphagia, dental problems, itching, sneezing,  nasal congestion or discharge of excess mucus or purulent  secretions, ear ache,   fever, chills, sweats, unintended wt loss or wt gain, classically pleuritic or exertional cp,  orthopnea pnd or arm/hand swelling  or leg swelling, presyncope, palpitations, abdominal pain, anorexia, nausea, vomiting, diarrhea  or change in bowel habits or change in bladder habits, change in stools or change in urine, dysuria, hematuria,  rash, arthralgias, visual complaints, headache, numbness, weakness or ataxia or problems with walking or coordination,  change in mood or  memory.        Current Meds  Medication Sig  . Albuterol Sulfate (PROAIR HFA IN) Inhale 2 puffs into the lungs every 4 (four) hours as needed.  Marland Kitchen azelastine (ASTELIN) 0.1 % nasal spray Place into both nostrils 2 (two) times daily. Use in each nostril as directed  . b complex vitamins tablet Take 1 tablet by mouth daily.  . budesonide-formoterol (SYMBICORT) 80-4.5 MCG/ACT inhaler Take 2 puffs first thing in am and then another 2 puffs about 12 hours later.  . cholecalciferol (VITAMIN D3) 25 MCG (1000 UNIT) tablet Take 1,000 Units by mouth daily.  . Coenzyme Q10 200 MG capsule Take 200 mg by mouth daily.  . Docosahexaenoic Acid (DHA) 200 MG CAPS Take by mouth.  . famotidine (PEPCID) 20 MG tablet One after bfast and one  after supper  . guaiFENesin (MUCINEX) 600 MG 12 hr tablet Take 600 mg by mouth 2 (two) times daily as needed.  Marland Kitchen ibuprofen (ADVIL) 200 MG tablet Take 200 mg by mouth every 6 (six) hours as needed.  Marland Kitchen ipratropium (ATROVENT) 0.02 % nebulizer solution Take 0.5 mg by nebulization every 4 (four) hours as needed for wheezing or shortness of breath.  . levothyroxine (SYNTHROID) 112 MCG tablet Take 112 mcg by mouth daily before breakfast.  . liraglutide (VICTOZA) 18 MG/3ML SOPN Inject into the skin.  Marland Kitchen loratadine-pseudoephedrine (CLARITIN-D 24-HOUR) 10-240 MG 24 hr tablet Take 1 tablet by mouth daily.  Marland Kitchen LORazepam (ATIVAN) 0.5 MG tablet Take 0.5-1 mg by mouth at bedtime.  Marland Kitchen losartan (COZAAR) 50  MG tablet Take 50 mg by mouth daily.  . magnesium oxide (MAG-OX) 400 MG tablet Take 400 mg by mouth daily.  . montelukast (SINGULAIR) 10 MG tablet Take 1 tablet (10 mg total) by mouth at bedtime.  . naproxen sodium (ALEVE) 220 MG tablet Take 220 mg by mouth daily as needed.  . sodium chloride (OCEAN) 0.65 % SOLN nasal spray Place 1 spray into both nostrils as needed for congestion.  Marland Kitchen spironolactone (ALDACTONE) 25 MG tablet Take 25 mg by mouth daily.  Marland Kitchen venlafaxine (EFFEXOR) 25 MG tablet Take 25 mg by mouth  daily.  . vitamin E (VITAMIN E) 180 MG (400 UNITS) capsule Take 400 Units by mouth daily.                    Past Medical History:  Diagnosis Date  . Asthma   . Hypertension   . Thyroid disease        Objective:    Wt Readings from Last 3 Encounters:  05/31/20 213 lb (96.6 kg)  01/26/20 217 lb (98.4 kg)  11/16/19 220 lb (99.8 kg)    amb pleasant wf nad   Vital signs reviewed - Note on arrival 05/31/2020  02 sats  98% on RA      HEENT : pt wearing mask not removed for exam due to covid -19 concerns.    NECK :  without JVD/Nodes/TM/ nl carotid upstrokes bilaterally   LUNGS: no acc muscle use,  Nl contour chest which is clear to A and P bilaterally without cough on insp or exp maneuvers   CV:  RRR  no s3 or murmur or increase in P2, and no edema   ABD: obese  soft and nontender with nl inspiratory excursion in the supine position. No bruits or organomegaly appreciated, bowel sounds nl  MS:  Nl gait/ ext warm without deformities, calf tenderness, cyanosis or clubbing No obvious joint restrictions   SKIN: warm and dry without lesions    NEURO:  alert, approp, nl sensorium with  no motor or cerebellar deficits apparent.        Assessment

## 2020-05-31 NOTE — Progress Notes (Signed)
Full PFT performed today. °

## 2020-05-31 NOTE — Patient Instructions (Addendum)
For  any nasal symptoms   first try dymista one twice daily and add clariton D as needed   No change symbicort / singulair    Please schedule a follow up visit in 12 months but call sooner if needed

## 2020-05-31 NOTE — Assessment & Plan Note (Signed)
Trial of dymista 05/31/2020   Advised on how to use topical rx above first and just add clariton d prn if symptoms not well controlled then allergy eval next step if nasal symptoms don't improve           Each maintenance medication was reviewed in detail including emphasizing most importantly the difference between maintenance and prns and under what circumstances the prns are to be triggered using an action plan format where appropriate.  Total time for H and P, chart review, counseling, teaching device and generating customized AVS unique to this office visit / charting =  25 min

## 2020-05-31 NOTE — Assessment & Plan Note (Signed)
Onset around 2010 in pt with allergic rhinitis - allergy testing ? 2010  in [place]  Pos multiple sources including dogs / cat / trees  - 11/16/2019   try symbicort 80 2bid instead of wixella (DPI) > much better - 01/26/2020  After extensive coaching inhaler device,  effectiveness =    90% from baseline 75% - PFT's  05/31/2020  FEV1 1.96 (109 % ) ratio 0.78  p 9 % improvement from saba p symbiocrt 80 x 2 puffs  prior to study with DLCO  23.79 (135%) corrects to 4.85 (116%)  for alv volume and FV curve min concave   All goals of chronic asthma control met including optimal function and elimination of symptoms with minimal need for rescue therapy.  Contingencies discussed in full including contacting this office immediately if not controlling the symptoms using the rule of two's.

## 2020-07-25 ENCOUNTER — Telehealth: Payer: Self-pay | Admitting: Internal Medicine

## 2020-07-25 MED ORDER — CEFDINIR 300 MG PO CAPS: 300.0000 mg | ORAL_CAPSULE | Freq: Two times a day (BID) | ORAL | 0 refills | Status: DC

## 2020-07-25 NOTE — Telephone Encounter (Signed)
omnicef 300 mg bid x 10 days

## 2020-07-25 NOTE — Telephone Encounter (Signed)
Called and spoke with pt who has had cold symptoms which began 5 days ago including runny nose, pain in sinuses, coughing up dark green-yellow phlegm which began 3 days ago. Pt said she is coughing up a lot of phlegm. No complaints of wheezing today 11/11 but did have some wheezing yesterday 11/10.  Pt is fatigued. States she does not feel the same as she usually does when she had an asthma attack.  Denies any complaints of fever, has not checked temp, but does not feel feverish.  Pt is using symbicort inhaler as prescribed. Pt has not had to use her atrovent neb sol nor her rescue inhaler.  Pt has not taken the prednisone as she was unsure if she should start taking it.  Pt is taking mucinex as well as Claritin-D daily.  Pt has been taking Aleve as she has had a headache from the sinus pressure and congestion. When pt blows her nose, nasal drainage is mainly clear but does have some yellow tinge to it.  Pt wants recommendations to help with her symptoms. Dr. Melvyn Novas, please advise.   Assessment        Assessment & Plan Note by Tanda Rockers, MD at 05/31/2020 3:53 PM Author: Tanda Rockers, MD Author Type: Physician Filed: 05/31/2020 3:54 PM  Note Status: Written Cosign: Cosign Not Required Encounter Date: 05/31/2020  Problem: Rhinitis, chronic  Editor: Tanda Rockers, MD (Physician)                 Trial of dymista 05/31/2020   Advised on how to use topical rx above first and just add clariton d prn if symptoms not well controlled then allergy eval next step if nasal symptoms don't improve           Each maintenance medication was reviewed in detail including emphasizing most importantly the difference between maintenance and prns and under what circumstances the prns are to be triggered using an action plan format where appropriate.  Total time for H and P, chart review, counseling, teaching device and generating customized AVS unique to this office visit / charting =   25 min         Assessment & Plan Note by Tanda Rockers, MD at 05/31/2020 3:52 PM Author: Tanda Rockers, MD Author Type: Physician Filed: 05/31/2020 3:53 PM  Note Status: Written Cosign: Cosign Not Required Encounter Date: 05/31/2020  Problem: Cough variant asthma  Editor: Tanda Rockers, MD (Physician)                 Onset around 2010 in pt with allergic rhinitis - allergy testing ? 2010  [place]  Pos multiple sources including dogs / cat / trees  - 11/16/2019   try symbicort 80 2bid instead of wixella (DPI) > much better - 01/26/2020  After extensive coaching inhaler device,  effectiveness =    90% from baseline 75% - PFT's  05/31/2020  FEV1 1.96 (109 % ) ratio 0.78  p 9 % improvement from saba p symbiocrt 80 x 2 puffs  prior to study with DLCO  23.79 (135%) corrects to 4.85 (116%)  for alv volume and FV curve min concave   All goals of chronic asthma control met including optimal function and elimination of symptoms with minimal need for rescue therapy.  Contingencies discussed in full including contacting this office immediately if not controlling the symptoms using the rule of two's.       Patient Instructions by Melvyn Novas,  Christena Deem, MD at 05/31/2020 3:00 PM Author: Tanda Rockers, MD Author Type: Physician Filed: 05/31/2020 3:50 PM  Note Status: Addendum Cosign: Cosign Not Required Encounter Date: 05/31/2020  Editor: Tanda Rockers, MD (Physician)      Prior Versions: 1. Tanda Rockers, MD (Physician) at 05/31/2020 3:24 PM - Addendum   2. Tanda Rockers, MD (Physician) at 05/31/2020 3:23 PM - Addendum   3. Tanda Rockers, MD (Physician) at 05/31/2020 3:18 PM - Signed      For  any nasal symptoms   first try dymista one twice daily and add clariton D as needed   No change symbicort / singulair    Please schedule a follow up visit in 12 months but call sooner if needed

## 2020-07-25 NOTE — Telephone Encounter (Signed)
Called and spoke with pt letting her know the info stated by MW and she verbalized understanding. Verified preferred pharmacy and sent Rx for omnicef to pharmacy for her. Nothing further needed.

## 2020-09-17 ENCOUNTER — Telehealth: Payer: Self-pay | Admitting: Internal Medicine

## 2020-09-17 MED ORDER — PREDNISONE 10 MG PO TABS: ORAL_TABLET | ORAL | 0 refills | Status: DC

## 2020-09-17 MED ORDER — AZITHROMYCIN 250 MG PO TABS: ORAL_TABLET | ORAL | 0 refills | Status: DC

## 2020-09-17 NOTE — Telephone Encounter (Signed)
Pt called back, aware of recs.  rx's sent to preferred pharmacy.  Nothing further needed at this time- will close encounter.

## 2020-09-17 NOTE — Telephone Encounter (Signed)
Primary Pulmonologist:   Wert Last office visit and with whom: 05/31/2020 What do we see them for (pulmonary problems): Cough variant asthma Last OV assessment/plan: Assessment & Plan Note by Tanda Rockers, MD at 05/31/2020 3:53 PM  Author: Tanda Rockers, MD Author Type: Physician Filed: 05/31/2020 3:54 PM  Note Status: Written Cosign: Cosign Not Required Encounter Date: 05/31/2020  Problem: Rhinitis, chronic  Editor: Tanda Rockers, MD (Physician)               Trial of dymista 05/31/2020   Advised on how to use topical rx above first and just add clariton d prn if symptoms not well controlled then allergy eval next step if nasal symptoms don't improve           Each maintenance medication was reviewed in detail including emphasizing most importantly the difference between maintenance and prns and under what circumstances the prns are to be triggered using an action plan format where appropriate.  Total time for H and P, chart review, counseling, teaching device and generating customized AVS unique to this office visit / charting =  25 min            Assessment & Plan Note by Tanda Rockers, MD at 05/31/2020 3:52 PM  Author: Tanda Rockers, MD Author Type: Physician Filed: 05/31/2020 3:53 PM  Note Status: Written Cosign: Cosign Not Required Encounter Date: 05/31/2020  Problem: Cough variant asthma  Editor: Tanda Rockers, MD (Physician)               Onset around 2010 in pt with allergic rhinitis - allergy testing ? 2010  [place]  Pos multiple sources including dogs / cat / trees  - 11/16/2019   try symbicort 80 2bid instead of wixella (DPI) > much better - 01/26/2020  After extensive coaching inhaler device,  effectiveness =    90% from baseline 75% - PFT's  05/31/2020  FEV1 1.96 (109 % ) ratio 0.78  p 9 % improvement from saba p symbiocrt 80 x 2 puffs  prior to study with DLCO  23.79 (135%) corrects to 4.85 (116%)  for alv volume and FV curve min concave   All goals of  chronic asthma control met including optimal function and elimination of symptoms with minimal need for rescue therapy.  Contingencies discussed in full including contacting this office immediately if not controlling the symptoms using the rule of two's.          Patient Instructions by Tanda Rockers, MD at 05/31/2020 3:00 PM  Author: Tanda Rockers, MD Author Type: Physician Filed: 05/31/2020 3:50 PM  Note Status: Addendum Cosign: Cosign Not Required Encounter Date: 05/31/2020  Editor: Tanda Rockers, MD (Physician)      Prior Versions: 1. Tanda Rockers, MD (Physician) at 05/31/2020 3:24 PM - Addendum   2. Tanda Rockers, MD (Physician) at 05/31/2020 3:23 PM - Addendum   3. Tanda Rockers, MD (Physician) at 05/31/2020 3:18 PM - Signed    For  any nasal symptoms   first try dymista one twice daily and add clariton D as needed   No change symbicort / singulair    Please schedule a follow up visit in 12 months but call sooner if needed         Instructions  For  any nasal symptoms   first try dymista one twice daily and add clariton D as needed   No change symbicort / singulair  Please schedule a follow up visit in 12 months but call sooner if needed        Reason for call:   Cough with clear mucus since 09/14/2020.  On 09/16/2020 the mucus changed to green.  She has sinus congestion and wheezing.  She had to use her albuterol 3 times yesterday so she could breathe.  She denies any fever, chills, body aches.  She is using musinex DM and Claritin.  She has had her 3 covid vaccines.  She is using her respiratory medications as prescribed.  She is asking if she needs to take the Prednisone that she has available at the pharmacy?  Please advise.  (examples of things to ask: : When did symptoms start? Fever? Cough? Productive? Color to sputum? More sputum than usual? Wheezing? Have you needed increased oxygen? Are you taking your respiratory medications? What over the  counter measures have you tried?)  Allergies  Allergen Reactions  . Morphine And Related   . Other     brazile and macadamia nuts ANY NARCOTIC   . Propofol   . Sulfa Antibiotics     Immunization History  Administered Date(s) Administered  . Influenza, High Dose Seasonal PF 06/01/2017, 07/11/2018, 07/11/2018, 05/09/2019  . Influenza, Seasonal, Injecte, Preservative Fre 07/30/2014  . Influenza-Unspecified 06/26/2009, 06/26/2009, 06/12/2010, 06/12/2010, 05/29/2013, 05/29/2013, 07/30/2014, 07/30/2014, 06/01/2017, 07/11/2018, 07/11/2018, 05/09/2019, 05/09/2019  . PFIZER SARS-COV-2 Vaccination 11/20/2019, 11/20/2019, 12/11/2019, 12/11/2019  . Pneumococcal Conjugate-13 02/22/2014, 02/22/2014  . Pneumococcal Polysaccharide-23 09/15/2003, 09/15/2003, 02/16/2017, 02/16/2017  . Td 04/06/2013  . Tdap 03/23/2017, 03/23/2017, 01/14/2019  . Tetanus 04/06/2013  . Zoster 03/28/2014, 03/28/2014, 11/15/2018, 04/02/2019  . Zoster Recombinat (Shingrix) 11/15/2018, 11/15/2018, 04/02/2019, 04/02/2019

## 2020-09-17 NOTE — Telephone Encounter (Signed)
zpak  Prednisone 10 mg take  4 each am x 2 days,   2 each am x 2 days,  1 each am x 2 days and stop   Call by end of week for televisit if not a lot better by then

## 2020-09-17 NOTE — Telephone Encounter (Signed)
lmtcb for pt. Will send rx after speaking to pt.

## 2020-10-16 ENCOUNTER — Other Ambulatory Visit: Payer: Self-pay | Admitting: Internal Medicine

## 2020-10-16 MED ORDER — FAMOTIDINE 20 MG PO TABS: ORAL_TABLET | ORAL | 5 refills | Status: AC

## 2020-12-14 ENCOUNTER — Other Ambulatory Visit: Payer: Self-pay | Admitting: Internal Medicine

## 2020-12-14 DIAGNOSIS — J45991 Cough variant asthma: Secondary | ICD-10-CM

## 2021-01-13 ENCOUNTER — Other Ambulatory Visit: Payer: Self-pay | Admitting: Internal Medicine

## 2021-01-15 ENCOUNTER — Telehealth: Payer: Self-pay | Admitting: Internal Medicine

## 2021-01-15 MED ORDER — DOXYCYCLINE HYCLATE 100 MG PO TABS
100.0000 mg | ORAL_TABLET | Freq: Two times a day (BID) | ORAL | 0 refills | Status: DC
Start: 2021-01-15 — End: 2021-06-09

## 2021-01-15 NOTE — Telephone Encounter (Signed)
ATC patient to get more information LMTCB °

## 2021-01-15 NOTE — Telephone Encounter (Signed)
Called and spoke with patient who states that she got a Prednisone RX from Dr. Sherene Sires to have on hand and was asked to inform him if she had to take Prednisone. She stated that she had to take some last night. She states that she caught a cold from toddler that got it from day care and also has allergies. She has a productive cough with light yellow sputum, felt like her lungs were collapsing & couldnt breathe around 8:30 last night or get a good breath. Denies any fever  Dr. Sherene Sires please advise

## 2021-01-15 NOTE — Telephone Encounter (Signed)
Called and spoke with patient. She verbalized understanding. She is aware to only take the doxy if her mucus turns yellow or green. I have sent in the doxy for her to have just in case.   Nothing further needed at time of call.

## 2021-01-15 NOTE — Telephone Encounter (Signed)
I called and spoke with the pt and she stated that she is following the AVS with the [facility] plan as directed.    She stated that the zpak does not help her in any way.  If possible cough a different antibiotic be sent in?   MW please advise. Thanks  Allergies  Allergen Reactions  . Morphine And Related   . Other     brazile and macadamia nuts ANY NARCOTIC   . Propofol   . Sulfa Antibiotics

## 2021-01-15 NOTE — Telephone Encounter (Signed)
Instructions she's referring to are ABCD    D =  Prednisone 10 mg take  4 each am x 2 days,   2 each am x 2 days,  1 each am x 2 days and stop  Be sure she has the ABC plan from the AVS and is following it   If not improving will need to be seen by the weekend, if getting worse go to ER  Also rx Zpak

## 2021-01-15 NOTE — Telephone Encounter (Signed)
Tell her then to let the prednisone work as written and only if the mucus turns yellow green would I take an antibiotic = doxy x 100 bid x 7 days   (I was recommending the zpak for inflammatin for inflammation, not infection, but the prednisone should work fine by itself if she'll take it as reec)

## 2021-01-20 ENCOUNTER — Other Ambulatory Visit: Payer: Self-pay | Admitting: Family Medicine

## 2021-01-20 ENCOUNTER — Ambulatory Visit
Admission: RE | Admit: 2021-01-20 | Discharge: 2021-01-20 | Disposition: A | Discharge status: Home / Self Care | Payer: Medicare HMO | Source: Ambulatory Visit | Attending: Family Medicine | Admitting: Family Medicine

## 2021-01-20 DIAGNOSIS — R062 Wheezing: Secondary | ICD-10-CM

## 2021-04-11 ENCOUNTER — Other Ambulatory Visit: Payer: Self-pay | Admitting: Internal Medicine

## 2021-04-17 ENCOUNTER — Telehealth: Payer: Self-pay | Admitting: Internal Medicine

## 2021-04-17 NOTE — Telephone Encounter (Signed)
Called and spoke with Patient.  Patient stated her Granddaughter tested covid positive 2 days ago, with a covid PCR test.  Patient's son tested positive for covid today.  Patient stated she took a home covid test 2 days ago and it was negative.  Patient stated she bought several home covid test, because her Pharmacist advised her to retest every 2 days or if she develops symptoms of covid. Patient asked what symptoms she should be aware of as covid.  Reviewed covid symptoms. Advised when to seek emergency care. Advised Patient to continue to wear mask, use separate bathroom, handwashing, and isolation. Advised Patient to monitor for symptoms and test with symptoms. Understanding stated. Patient stated she would call if she receives any positive covid test. Patient denies any symptoms of covid, at this time.  Nothing further at this time.

## 2021-04-18 ENCOUNTER — Other Ambulatory Visit: Payer: Self-pay

## 2021-04-18 ENCOUNTER — Emergency Department (HOSPITAL_COMMUNITY)
Admission: EM | Admit: 2021-04-18 | Discharge: 2021-04-18 | Disposition: A | Discharge status: Home / Self Care | Payer: Medicare HMO | Attending: Student | Admitting: Student

## 2021-04-18 ENCOUNTER — Emergency Department (HOSPITAL_COMMUNITY): Payer: Medicare HMO

## 2021-04-18 ENCOUNTER — Encounter (HOSPITAL_COMMUNITY): Payer: Self-pay

## 2021-04-18 ENCOUNTER — Telehealth: Payer: Self-pay | Admitting: Internal Medicine

## 2021-04-18 DIAGNOSIS — R519 Headache, unspecified: Secondary | ICD-10-CM | POA: Diagnosis present

## 2021-04-18 DIAGNOSIS — U071 COVID-19: Secondary | ICD-10-CM | POA: Diagnosis not present

## 2021-04-18 DIAGNOSIS — Z5321 Procedure and treatment not carried out due to patient leaving prior to being seen by health care provider: Secondary | ICD-10-CM | POA: Diagnosis not present

## 2021-04-18 LAB — CBC WITH DIFFERENTIAL/PLATELET
Abs Immature Granulocytes: 0.02 10*3/uL (ref 0.00–0.07)
Basophils Absolute: 0 10*3/uL (ref 0.0–0.1)
Basophils Relative: 1 %
Eosinophils Absolute: 0.1 10*3/uL (ref 0.0–0.5)
Eosinophils Relative: 3 %
HCT: 42.1 % (ref 36.0–46.0)
Hemoglobin: 14 g/dL (ref 12.0–15.0)
Immature Granulocytes: 1 %
Lymphocytes Relative: 11 %
Lymphs Abs: 0.5 10*3/uL — ABNORMAL LOW (ref 0.7–4.0)
MCH: 30.4 pg (ref 26.0–34.0)
MCHC: 33.3 g/dL (ref 30.0–36.0)
MCV: 91.3 fL (ref 80.0–100.0)
Monocytes Absolute: 0.5 10*3/uL (ref 0.1–1.0)
Monocytes Relative: 11 %
Neutro Abs: 2.9 10*3/uL (ref 1.7–7.7)
Neutrophils Relative %: 73 %
Platelets: 141 10*3/uL — ABNORMAL LOW (ref 150–400)
RBC: 4.61 MIL/uL (ref 3.87–5.11)
RDW: 13.4 % (ref 11.5–15.5)
WBC: 4 10*3/uL (ref 4.0–10.5)
nRBC: 0 % (ref 0.0–0.2)

## 2021-04-18 LAB — COMPREHENSIVE METABOLIC PANEL
ALT: 18 U/L (ref 0–44)
AST: 21 U/L (ref 15–41)
Albumin: 4.3 g/dL (ref 3.5–5.0)
Alkaline Phosphatase: 37 U/L — ABNORMAL LOW (ref 38–126)
Anion gap: 8 (ref 5–15)
BUN: 17 mg/dL (ref 8–23)
CO2: 26 mmol/L (ref 22–32)
Calcium: 8.9 mg/dL (ref 8.9–10.3)
Chloride: 104 mmol/L (ref 98–111)
Creatinine, Ser: 0.68 mg/dL (ref 0.44–1.00)
GFR, Estimated: >60 mL/min (ref >60)
Glucose, Bld: 90 mg/dL (ref 70–99)
Potassium: 3.6 mmol/L (ref 3.5–5.1)
Sodium: 138 mmol/L (ref 135–145)
Total Bilirubin: 0.7 mg/dL (ref 0.3–1.2)
Total Protein: 6.8 g/dL (ref 6.5–8.1)

## 2021-04-18 LAB — LIPASE, BLOOD: Lipase: 31 U/L (ref 11–51)

## 2021-04-18 LAB — URINALYSIS, ROUTINE W REFLEX MICROSCOPIC
Bacteria, UA: NONE SEEN
Bilirubin Urine: NEGATIVE
Glucose, UA: NEGATIVE mg/dL
Hgb urine dipstick: NEGATIVE
Ketones, ur: NEGATIVE mg/dL
Nitrite: NEGATIVE
Protein, ur: NEGATIVE mg/dL
Specific Gravity, Urine: 1.016 (ref 1.005–1.030)
pH: 6 (ref 5.0–8.0)

## 2021-04-18 NOTE — Telephone Encounter (Signed)
Called spoke with patient.  She tested positive for covid last night. N/V/D bad headache, fatigued and weakness. Fever 101 taking tylenol and its helping. Wondering if anything else can be called in.    Sending to Dr. Sherene Sires for recommendations.

## 2021-04-18 NOTE — ED Triage Notes (Signed)
Covid + 04/17/2021. Pt reports at 0500 n/v/d/headache/body aches. Tylenol with relief at home at 0900

## 2021-04-18 NOTE — Telephone Encounter (Signed)
Sounds too sick to treat over the phone and will need labs before paxlovid, also risks getting dehydrated so rec ER ASAP

## 2021-04-18 NOTE — ED Provider Notes (Signed)
Emergency Medicine Provider Triage Evaluation Note  Jeanne Ortiz , a 80 y.o. female  was evaluated in triage.  Pt complains of COVID + yesterday with myalgias, N/V/D, chills, fevers. Baseline SOB with asthma, not worsened. NBNB emesis x 3, Diarrhea, without melena or hematochezia.   Review of Systems  Positive: N/V/D/fevers, chills, myalgias, diarrhea Negative: SOB, CP, palpitations  Physical Exam  BP (!) 176/87 (BP Location: Left Arm)   Pulse 100   Temp 99 F (37.2 C) (Oral)   Resp 20   SpO2 94%  Gen:   Awake, no distress   Resp:  Normal effort  MSK:   Moves extremities without difficulty  Other:  Wheezing in the lung bases, RRR no m/r/g. Coughing throughout pulm exam  Medical Decision Making  Medically screening exam initiated at 2:27 PM.  Appropriate orders placed.  Javeah K Faulkenberry was informed that the remainder of the evaluation will be completed by another provider, this initial triage assessment does not replace that evaluation, and the importance of remaining in the ED until their evaluation is complete.  This chart was dictated using voice recognition software, Dragon. Despite the best efforts of this provider to proofread and correct errors, errors may still occur which can change documentation meaning.    Paris Lore, PA-C 04/18/21 1431    Gloris Manchester, MD 04/18/21 [phone]

## 2021-04-18 NOTE — Telephone Encounter (Signed)
Called spoke with patient Let her know Dr. Thurston Hole recommendations, she voiced understanding, nothing further needed at this time.

## 2021-04-19 ENCOUNTER — Telehealth: Payer: Self-pay | Admitting: Internal Medicine

## 2021-04-19 ENCOUNTER — Other Ambulatory Visit: Payer: Self-pay | Admitting: Internal Medicine

## 2021-04-19 MED ORDER — NIRMATRELVIR/RITONAVIR (PAXLOVID)TABLET: ORAL_TABLET | ORAL | 0 refills | Status: DC

## 2021-04-19 NOTE — Telephone Encounter (Signed)
Onset of symptoms 04/17/21 slt cough/ aching / ha / no desats or sob   Rec paxlovid if get worse, to ER if desats.

## 2021-05-06 ENCOUNTER — Telehealth: Payer: Self-pay | Admitting: Internal Medicine

## 2021-05-06 DIAGNOSIS — H833X9 Noise effects on inner ear, unspecified ear: Secondary | ICD-10-CM

## 2021-05-06 NOTE — Telephone Encounter (Signed)
Pt is calling to get an appt with ENT.  She stated that she has asthma and she has not been able to hear very well in the last 3 weeks.  This was after a bad ear infection and she did have covid as well.  MW please advise. Thanks

## 2021-05-07 NOTE — Telephone Encounter (Signed)
Called and spoke with patient. She verbalized understanding and took the info for Dr. Thurmon Fair office. Referral has been placed.   Nothing further needed at time of call.

## 2021-05-07 NOTE — Telephone Encounter (Signed)
Ok to refer to Dr Thurmon Fair group

## 2021-06-02 ENCOUNTER — Ambulatory Visit: Payer: Medicare Other | Admitting: Internal Medicine

## 2021-06-03 ENCOUNTER — Other Ambulatory Visit: Payer: Self-pay | Admitting: Internal Medicine

## 2021-06-09 ENCOUNTER — Ambulatory Visit (INDEPENDENT_AMBULATORY_CARE_PROVIDER_SITE_OTHER): Payer: Medicare HMO

## 2021-06-09 ENCOUNTER — Other Ambulatory Visit: Payer: Self-pay

## 2021-06-09 ENCOUNTER — Encounter: Payer: Self-pay | Admitting: Internal Medicine

## 2021-06-09 ENCOUNTER — Ambulatory Visit: Payer: Medicare HMO | Admitting: Internal Medicine

## 2021-06-09 VITALS — BP 162/70 | HR 83 | Temp 98.0°F | Ht 60.5 in | Wt 225.4 lb

## 2021-06-09 DIAGNOSIS — J45991 Cough variant asthma: Secondary | ICD-10-CM

## 2021-06-09 DIAGNOSIS — R079 Chest pain, unspecified: Secondary | ICD-10-CM

## 2021-06-09 MED ORDER — METOPROLOL TARTRATE 25 MG PO TABS: 25.0000 mg | ORAL_TABLET | Freq: Two times a day (BID) | ORAL | 2 refills | Status: DC

## 2021-06-09 NOTE — Assessment & Plan Note (Addendum)
Onset p covid 04/2021 -ekg c cp with walking 59ft @ sats at avg pace =  93%   > no acute st changes  ? 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  ddx also includes GERD so needs cards eval/ lopressor while waiting, warned re crescendo pattern needing eval  At [facility] asap.  Each maintenance medication was reviewed in detail including emphasizing most importantly the difference between maintenance and prns and under what circumstances the prns are to be triggered using an action plan format where appropriate.  Total time for H and P, chart review, counseling, reviewing hfa device(s) , directly observing portions of ambulatory 02 saturation study/ and generating customized AVS unique to this office visit addressing prior chronic problme and one new and potentially very serious one / same day charting > 40 min

## 2021-06-09 NOTE — Progress Notes (Signed)
Lorry Cleda Mccreedy, female    DOB: June 06, 1941    MRN: [id number]   Brief patient profile:  80yowf never smoker onset in teenager years in [place] with intermittent non-seasonal rhinitis more problem around age 80 with year round symptoms no better with sinus surgeries / allergy eval pos birch, horses cats/dog never took shots  with new onset cough/wheezing  around 2010 eval by [facility] rx alb/antihistamines then he left for [place] [facility]  and then seen  By [facility]  rec gerd rx didn't help and side effects = crampy  then nucala did not take due to cost and worse cough/sob since arrived in [facility] March 2020 so referred to pulmonary clinic 11/16/2019 by [name]  NP.  Living in house built in 1980s with house with crawl space/ 2 cats / golden retriever    History of Present Illness  11/16/2019  Pulmonary/ 1st office eval/Benisha Hadaway / maint on singulair and generic advair Chief Complaint  Patient presents with   Pulmonary Consult    Referred by [name], NP. Pt c/o cough for the past 8-10 years.   Dyspnea:  MMRC2 = can't walk a nl pace on a flat grade s sob but does fine slow and flat leaning on cart at HT Cough: dry raspy after supper and hs  Sleep: flat bed 2 pillows  SABA use: daily now but up to 7x per day much less since last prednisone finished  4 days  prior to OV   Has overt HB but told can't take ppi due to renal dz rec .Plan A = Automatic = Always=    Symbicort 80 Take 2 puffs first thing in am and then another 2 puffs about 12 hours later.  Work on inhaler technique:   Pepcid 20 mg one after bfast and supper  Plan B = Backup (to supplement plan A, not to replace it) Only use your albuterol(poair)  inhaler as a rescue medication Plan C = Crisis (instead of Plan B but only if Plan B stops working) - only use your albuterol nebulizer if you first try Plan B  Plan D = Deltasone - if getting worse despite above then take Prednisone 10 mg take  4 each am x  2 days,   2 each am x 2 days,  1 each am x 2 days and stop       05/31/2020  f/u ov/Maylene Crocker re: asthma  maint on symbicort 80 2bid / singulair/ poor control of pnds on astelin prn and clariton d as maint  Chief Complaint  Patient presents with   Follow-up    PFT.  Cough has improved. She still has some cough that she relates to PND. She has not had to use her albuterol inhaler.   Dyspnea:  Not limited by breathing from desired activities   Cough: since august sinus infection has sense of pnds but does not typically keep her up  Sleeping: bed flat/ 2 pillows  SABA use:  None  02: none  Rec For  any nasal symptoms first try dymista one twice daily and add clariton D as needed   04/14/21 Covid and ever since then has had 2 episodes of chest tightness one week  06/09/2021  f/u ov/Marlita Keil re: asthma  maint on symbicort 160 2bid  Chief Complaint  Patient presents with   Follow-up    Pt c/o heaviness in her chest over the past month. She has had to use her rescue inhaler x 2 in  the past month. She is coughing some- relates to PND- non prod.   Dyspnea:  more limited by knees/was able to vacuum one day prior s symptoms  but had 2 prior episodes of doe with gen ant chest tightness, never at rest, not prevented by taking saba or symbicort prior to ex  Cough: none  Sleeping: bed flat bed  SABA use: only used saba twice in sept 2022 02: none  Covid status:   vax x 3    No obvious day to day or daytime variability or assoc excess/ purulent sputum or mucus plugs or hemoptysis or  subjective wheeze or overt sinus or hb symptoms.   Sleeping  without nocturnal  or early am exacerbation  of respiratory  c/o's or need for noct saba. Also denies any obvious fluctuation of symptoms with weather or environmental changes or other aggravating or alleviating factors except as outlined above   No unusual exposure hx or h/o childhood pna/ asthma or knowledge of premature birth.  Current Allergies, Complete Past  Medical History, Past Surgical History, Family History, and Social History were reviewed in [employer] record.  ROS  The following are not active complaints unless bolded Hoarseness, sore throat, dysphagia, dental problems, itching, sneezing,  nasal congestion or discharge of excess mucus or purulent secretions, ear ache,   fever, chills, sweats, unintended wt loss or wt gain, classically pleuritic or exertional cp,  orthopnea pnd or arm/hand swelling  or leg swelling, presyncope, palpitations, abdominal pain, anorexia, nausea, vomiting, diarrhea  or change in bowel habits or change in bladder habits, change in stools or change in urine, dysuria, hematuria,  rash, arthralgias, visual complaints, headache, numbness, weakness or ataxia or problems with walking or coordination,  change in mood or  memory.        Current Meds  Medication Sig   Albuterol Sulfate (PROAIR HFA IN) Inhale 2 puffs into the lungs every 4 (four) hours as needed.   Azelastine-Fluticasone (DYMISTA) 137-50 MCG/ACT SUSP Place 1 puff into the nose 2 (two) times daily.   b complex vitamins tablet Take 1 tablet by mouth daily.   budesonide-formoterol (SYMBICORT) 80-4.5 MCG/ACT inhaler INHALE 2 PUFFS FIRST THING IN AM, THEN ANOTHER 2 PUFFS ABOUT 12 HOURS LATER   cholecalciferol (VITAMIN D3) 25 MCG (1000 UNIT) tablet Take 1,000 Units by mouth daily.   Coenzyme Q10 200 MG capsule Take 200 mg by mouth daily.   Docosahexaenoic Acid (DHA) 200 MG CAPS Take by mouth.   famotidine (PEPCID) 20 MG tablet One after bfast and one  after supper   guaiFENesin (MUCINEX) 600 MG 12 hr tablet Take 600 mg by mouth 2 (two) times daily as needed.   ibuprofen (ADVIL) 200 MG tablet Take 200 mg by mouth every 6 (six) hours as needed.   ketotifen (ZADITOR) 0.025 % ophthalmic solution Place 1 drop into the right eye 2 (two) times daily.   levothyroxine (SYNTHROID) 112 MCG tablet Take 112 mcg by mouth daily before breakfast.    liraglutide (VICTOZA) 18 MG/3ML SOPN Inject into the skin.   LORazepam (ATIVAN) 0.5 MG tablet Take 0.5-1 mg by mouth at bedtime.   losartan (COZAAR) 50 MG tablet Take 50 mg by mouth daily.   magnesium oxide (MAG-OX) 400 MG tablet Take 400 mg by mouth daily.   montelukast (SINGULAIR) 10 MG tablet TAKE 1 TABLET BY MOUTH EVERYDAY AT BEDTIME   naproxen sodium (ALEVE) 220 MG tablet Take 220 mg by mouth daily as needed.  Nutritional Supplements (DHEA PO) Take 1 capsule by mouth daily.        sodium chloride (OCEAN) 0.65 % SOLN nasal spray Place 1 spray into both nostrils as needed for congestion.   spironolactone (ALDACTONE) 25 MG tablet Take 50 mg by mouth daily.   venlafaxine (EFFEXOR) 25 MG tablet Take 25 mg by mouth daily.   vitamin E 180 MG (400 UNITS) capsule Take 400 Units by mouth daily.                  Past Medical History:  Diagnosis Date   Asthma    Hypertension    Thyroid disease        Objective:     06/09/2021      225   05/31/20 213 lb (96.6 kg)  01/26/20 217 lb (98.4 kg)  11/16/19 220 lb (99.8 kg)     Vital signs reviewed  06/09/2021  - Note at rest 02 sats  95% on RA   General appearance:    amb wf nad     HEENT : pt wearing mask not removed for exam due to covid -19 concerns.    NECK :  without JVD/Nodes/TM/ nl carotid upstrokes bilaterally   LUNGS: no acc muscle use,  Nl contour chest which is clear to A and P bilaterally without cough on insp or exp maneuvers   CV:  RRR  no s3 or murmur or increase in P2, and no edema   ABD:  soft and nontender with nl inspiratory excursion in the supine position. No bruits or organomegaly appreciated, bowel sounds nl  MS:  Nl gait/ ext warm without deformities, calf tenderness, cyanosis or clubbing No obvious joint restrictions   SKIN: warm and dry without lesions    NEURO:  alert, approp, nl sensorium with  no motor or cerebellar deficits apparent.    Ekg p ex, no acute ischemic change ? 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

## 2021-06-09 NOTE — Patient Instructions (Addendum)
Ok to try albuterol 15 min before an activity (on alternating days)  that you know would usually make you short of breath and see if it makes any difference and if makes none then don't take albuterol after activity unless you can't catch your breath as this means it's the resting that helps, not the albuterol.      Work on inhaler technique:  relax and gently blow all the way out then take a nice smooth full deep breath back in, triggering the inhaler at same time you start breathing in.  Hold for up to 5 seconds if you can. Blow symbicort out thru your nose.  Rinse and gargle with water when done.  If mouth or throat bother you at all,  try brushing teeth/gums/tongue with arm and hammer toothpaste/ make a slurry and gargle and spit out.   Lopressor (metaprolol) 25 mg twice daily in addition to your present blood pressure medication will lower your risk of a heart attack  Stay on pepcid after breakfast and supper  We will be referring you for cardiology evaluation   If chest pain with activity getting worse or with less level of exertion or at rest go to [place] as you may need more immediate attention  Please remember to go to the  x-ray department  for your tests - we will call you with the results when they are available    Please schedule a follow up visit in 3 months but call sooner if needed

## 2021-06-09 NOTE — Assessment & Plan Note (Signed)
Onset around 2010 in pt with allergic rhinitis - allergy testing ? 2010  in [place]  Pos multiple sources including dogs / cat / trees  - 11/16/2019   try symbicort 80 2bid instead of wixella (DPI) > much better - 01/26/2020  After extensive coaching inhaler device,  effectiveness =    90% from baseline 75% - PFT's  05/31/2020  FEV1 1.96 (109 % ) ratio 0.78  p 9 % improvement from saba p symbicort 80 x 2 puffs  prior to study with DLCO  23.79 (135%) corrects to 4.85 (116%)  for alv volume and FV curve min concave  -  06/09/2021  After extensive coaching inhaler device,  effectiveness =   75% (ti too short)   All goals of chronic asthma control met including optimal function and elimination of symptoms with minimal need for rescue therapy.  Contingencies discussed in full including contacting this office immediately if not controlling the symptoms using the rule of two's.     rec Continue symbicort  80 Take 2 puffs first thing in am and then another 2 puffs about 12 hours later.   Re SABA :  I spent extra time with pt today reviewing appropriate use of albuterol for prn use on exertion with the following points: 1) saba is for relief of sob that does not improve by walking a slower pace or resting but rather if the pt does not improve after trying this first. 2) If the pt is convinced, as many are, that saba helps recover from activity faster then it's easy to tell if this is the case by re-challenging : ie stop, take the inhaler, then p 5 minutes try the exact same activity (intensity of workload) that just caused the symptoms and see if they are substantially diminished or not after saba 3) if there is an activity that reproducibly causes the symptoms, try the saba 15 min before the activity on alternate days   If in fact the saba really does help, then fine to continue to use it prn but advised may need to look closer at the maintenance regimen being used to achieve better control of airways disease with  exertion.

## 2021-06-18 ENCOUNTER — Ambulatory Visit: Payer: Medicare HMO | Admitting: Cardiovascular Disease

## 2021-07-15 ENCOUNTER — Other Ambulatory Visit: Payer: Self-pay | Admitting: Internal Medicine

## 2021-07-15 DIAGNOSIS — J45991 Cough variant asthma: Secondary | ICD-10-CM

## 2021-07-17 NOTE — Progress Notes (Signed)
Referring-Michael Wert MD Reason for referral-Chest pain  HPI: 80 year old female for evaluation of chest pain at request of Christinia Gully, MD.  Also with history of hypertension.  Patient has occasional chest tightness in the upper chest area without radiation.  It can occur with stress and is not exertional.  Not pleuritic or positional.  No associated symptoms.  Relieved with inhalers.  She otherwise has dyspnea with more vigorous activities but not routine activities.  No orthopnea or PND.  Occasional pedal edema if standing for long time or long trips.  No exertional chest pain.  No syncope.  Cardiology now asked to evaluate.  Current Outpatient Medications  Medication Sig Dispense Refill   Albuterol Sulfate (PROAIR HFA IN) Inhale 2 puffs into the lungs every 4 (four) hours as needed.     Azelastine-Fluticasone (DYMISTA) 137-50 MCG/ACT SUSP Place 1 puff into the nose 2 (two) times daily. 23 g 11   b complex vitamins tablet Take 1 tablet by mouth daily.     budesonide-formoterol (SYMBICORT) 80-4.5 MCG/ACT inhaler INHALE 2 PUFFS BY MOUTH FIRST THING IN AM, THEN 2 PUFFS ABOUT 12 HOURS LATER 10.2 each 6   cholecalciferol (VITAMIN D3) 25 MCG (1000 UNIT) tablet Take 1,000 Units by mouth daily.     Coenzyme Q10 200 MG capsule Take 200 mg by mouth daily.     Docosahexaenoic Acid (DHA) 200 MG CAPS Take by mouth.     famotidine (PEPCID) 20 MG tablet One after bfast and one  after supper 60 tablet 5   guaiFENesin (MUCINEX) 600 MG 12 hr tablet Take 600 mg by mouth 2 (two) times daily as needed.     ibuprofen (ADVIL) 200 MG tablet Take 200 mg by mouth every 6 (six) hours as needed.     ketotifen (ZADITOR) 0.025 % ophthalmic solution Place 1 drop into the right eye 2 (two) times daily.     levothyroxine (SYNTHROID) 112 MCG tablet Take 112 mcg by mouth daily before breakfast.     liraglutide (VICTOZA) 18 MG/3ML SOPN Inject into the skin.     LORazepam (ATIVAN) 0.5 MG tablet Take 0.5-1 mg by mouth at  bedtime.     losartan (COZAAR) 50 MG tablet Take 50 mg by mouth daily.     magnesium oxide (MAG-OX) 400 MG tablet Take 400 mg by mouth daily.     metoprolol tartrate (LOPRESSOR) 25 MG tablet TAKE 1 TABLET BY MOUTH TWICE A DAY 180 tablet 1   montelukast (SINGULAIR) 10 MG tablet TAKE 1 TABLET BY MOUTH EVERYDAY AT BEDTIME 30 tablet 0   naproxen sodium (ALEVE) 220 MG tablet Take 220 mg by mouth daily as needed.     Nutritional Supplements (DHEA PO) Take 1 capsule by mouth daily.     predniSONE (DELTASONE) 10 MG tablet 40mg X2 days, 20mg  X2 days, 10mg X2 days, then stop. 14 tablet 0   sodium chloride (OCEAN) 0.65 % SOLN nasal spray Place 1 spray into both nostrils as needed for congestion.     spironolactone (ALDACTONE) 25 MG tablet Take 50 mg by mouth daily.     venlafaxine (EFFEXOR) 25 MG tablet Take 25 mg by mouth daily.     vitamin E 180 MG (400 UNITS) capsule Take 400 Units by mouth daily.     No current facility-administered medications for this visit.    Allergies  Allergen Reactions   Morphine And Related    Other     brazile and macadamia nuts ANY NARCOTIC  Propofol    Sulfa Antibiotics      Past Medical History:  Diagnosis Date   Asthma    Hypertension    PUD (peptic ulcer disease)    Thyroid disease     Past Surgical History:  Procedure Laterality Date   ABDOMINAL HYSTERECTOMY     CATARACT EXTRACTION      Social History   Socioeconomic History   Marital status: Divorced    Spouse name: Not on file   Number of children: 3   Years of education: Not on file   Highest education level: Not on file  Occupational History   Not on file  Tobacco Use   Smoking status: Never   Smokeless tobacco: Never  Vaping Use   Vaping Use: Never used  Substance and Sexual Activity   Alcohol use: Never   Drug use: Not on file   Sexual activity: Not on file  Other Topics Concern   Not on file  Social History Narrative   Not on file   Social Determinants of Health    Financial Resource Strain: Not on file  Food Insecurity: Not on file  Transportation Needs: Not on file  Physical Activity: Not on file  Stress: Not on file  Social Connections: Not on file  Intimate Partner Violence: Not on file    Family History  Problem Relation Age of Onset   Stroke Father    Diabetes Father     ROS: no fevers or chills, productive cough, hemoptysis, dysphasia, odynophagia, melena, hematochezia, dysuria, hematuria, rash, seizure activity, orthopnea, PND, pedal edema, claudication. Remaining systems are negative.  Physical Exam:   Blood pressure 122/76, pulse 65, height 5' (1.524 m), weight 225 lb 9.6 oz (102.3 kg), SpO2 98 %.  General:  Well developed/well nourished in NAD Skin warm/dry Patient not depressed No peripheral clubbing Back-normal HEENT-normal/normal eyelids Neck supple/normal carotid upstroke bilaterally; no bruits; no JVD; no thyromegaly chest - CTA/ normal expansion CV - RRR/normal S1 and S2; no murmurs, rubs or gallops;  PMI nondisplaced Abdomen -NT/ND, no HSM, no mass, + bowel sounds, no bruit 2+ femoral pulses, no bruits Ext-no edema, chords, 2+ DP Neuro-grossly nonfocal  ECG -June 09, 2021-normal sinus rhythm with no ST changes.  Personally reviewed  Today's electrocardiogram shows sinus rhythm at a rate of 65 with no ST changes; personally reviewed.  A/P  1 chest pain-symptoms are atypical.  Electrocardiogram shows no ST changes.  We will arrange a Lexiscan nuclear study to screen for ischemia.  2 hypertension-blood pressure controlled.  Continue present medications and follow.  Olga Millers, MD

## 2021-07-26 ENCOUNTER — Other Ambulatory Visit: Payer: Self-pay | Admitting: Internal Medicine

## 2021-07-26 DIAGNOSIS — R079 Chest pain, unspecified: Secondary | ICD-10-CM

## 2021-07-28 ENCOUNTER — Ambulatory Visit: Payer: Medicare HMO | Admitting: Cardiology

## 2021-07-28 ENCOUNTER — Other Ambulatory Visit: Payer: Self-pay

## 2021-07-28 ENCOUNTER — Encounter: Payer: Self-pay | Admitting: Cardiology

## 2021-07-28 VITALS — BP 122/76 | HR 65 | Ht 60.0 in | Wt 225.6 lb

## 2021-07-28 DIAGNOSIS — I10 Essential (primary) hypertension: Secondary | ICD-10-CM | POA: Diagnosis not present

## 2021-07-28 DIAGNOSIS — R072 Precordial pain: Secondary | ICD-10-CM | POA: Diagnosis not present

## 2021-07-28 NOTE — Patient Instructions (Signed)
    Testing/Procedures:  Your physician has requested that you have a lexiscan myoview. For further information please visit [email or web address]. Please follow instruction sheet, as given. [address]  Follow-Up: At [facility], you and your health needs are our priority.  As part of our continuing mission to provide you with exceptional heart care, we have created designated Provider Care Teams.  These Care Teams include your primary Cardiologist (physician) and Advanced Practice Providers (APPs -  Physician Assistants and Nurse Practitioners) who all work together to provide you with the care you need, when you need it.  We recommend signing up for the patient portal called "MyChart".  Sign up information is provided on this After Visit Summary.  MyChart is used to connect with patients for Virtual Visits (Telemedicine).  Patients are able to view lab/test results, encounter notes, upcoming appointments, etc.  Non-urgent messages can be sent to your provider as well.   To learn more about what you can do with MyChart, go to [email or web address].    Your next appointment:    AS NEEDED 

## 2021-07-31 ENCOUNTER — Telehealth: Payer: Self-pay

## 2021-07-31 NOTE — Telephone Encounter (Signed)
Detailed instructions left on the patient's answering machine. Asked to call back with any questions. Jeanne Ortiz EMTP 

## 2021-08-05 ENCOUNTER — Encounter (HOSPITAL_COMMUNITY): Payer: Medicare HMO

## 2021-08-06 ENCOUNTER — Telehealth: Payer: Self-pay

## 2021-08-06 NOTE — Telephone Encounter (Signed)
Spoke with the patient, detailed instructions given. She stated that she would be here fro her test. She was advised that with her body habitus she may be a 2 day study. She was agreeable to that. Asked to call back with any questions. S.Roshawn Ayala EMTP

## 2021-08-11 ENCOUNTER — Telehealth: Payer: Self-pay | Admitting: Internal Medicine

## 2021-08-11 NOTE — Telephone Encounter (Signed)
Called and spoke with patient. She stated that she has been wheezing more for the past week. She went to an UC on Friday and they prescribed a prednisone dosepak as well as some cough syrup. She was tested for both COVID and flu and the results were negative. She didn't feel comfortable with taking these medications without seeing or hearing from MW. I advised her that MW was in [place] this week but I still get her scheduled with one of the NPs. She agreed. She has been scheduled to see Beth tomorrow at 10am.   While on the phone, she stated that she is scheduled for a cardiac stress test tomorrow and she wanted to know if she needed to keep that appt. I advised her that if she is not feeling well, it might be best to reschedule the test to a later date. She agreed and will call cardiology to get this rescheduled.   Nothing further needed at time of call.

## 2021-08-12 ENCOUNTER — Encounter: Payer: Self-pay | Admitting: Primary Care

## 2021-08-12 ENCOUNTER — Ambulatory Visit: Payer: Medicare HMO | Admitting: Primary Care

## 2021-08-12 ENCOUNTER — Encounter (HOSPITAL_COMMUNITY): Payer: Medicare HMO

## 2021-08-12 ENCOUNTER — Other Ambulatory Visit: Payer: Self-pay

## 2021-08-12 VITALS — BP 126/70 | HR 96 | Temp 98.6°F | Ht 61.5 in | Wt 215.8 lb

## 2021-08-12 DIAGNOSIS — J40 Bronchitis, not specified as acute or chronic: Secondary | ICD-10-CM | POA: Diagnosis not present

## 2021-08-12 DIAGNOSIS — J4521 Mild intermittent asthma with (acute) exacerbation: Secondary | ICD-10-CM | POA: Diagnosis not present

## 2021-08-12 DIAGNOSIS — J329 Chronic sinusitis, unspecified: Secondary | ICD-10-CM | POA: Diagnosis not present

## 2021-08-12 DIAGNOSIS — J45901 Unspecified asthma with (acute) exacerbation: Secondary | ICD-10-CM | POA: Insufficient documentation

## 2021-08-12 MED ORDER — AMOXICILLIN-POT CLAVULANATE 875-125 MG PO TABS: 1.0000 | ORAL_TABLET | Freq: Two times a day (BID) | ORAL | 0 refills | Status: DC

## 2021-08-12 MED ORDER — PREDNISONE 10 MG PO TABS: ORAL_TABLET | ORAL | 0 refills | Status: DC

## 2021-08-12 NOTE — Assessment & Plan Note (Deleted)
-   Acute exacerbation secondary to sinobronchitis. Patient developed head/chest congestion, cough and wheezing 10 days ago. She had some initial improvement with steroid dose pack. She is compliant with Symbicort 2 puffs BID. Sending in RX Augmentin 1 tab twice daily x 7 days and prednisone taper (40mg  x 3 days; 30mg  x 3 days; 20mg  x 3 days; 10mg  x 3 days). Recommend she resume Dymista nasal spray and continue mucinex 600mg  twice daily. Advised patient to notify our office if symptoms do not improve in 5-7 days or worsen.

## 2021-08-12 NOTE — Assessment & Plan Note (Signed)
-   Acute asthma exacerbation secondary to sinobronchitis. Patient developed head/chest congestion, cough and wheezing 10 days ago. She had some initial improvement with steroid dose pack. She is compliant with Symbicort 2 puffs BID. Sending in RX Augmentin 1 tab twice daily x 7 days and prednisone taper (40mg  x 3 days; 30mg  x 3 days; 20mg  x 3 days; 10mg  x 3 days). Recommend she resume Dymista nasal spray and continue mucinex 600mg  twice daily. Advised patient to notify our office if symptoms do not improve in 5-7 days or worsen.

## 2021-08-12 NOTE — Progress Notes (Signed)
@Patient  ID: , female    DOB: 02-07-1941, 80 y.o.   MRN: [id number]  Chief Complaint  Patient presents with   Follow-up    Wheezing, coughing    Referring provider: [id number], *  HPI: 80 year old female, never smoked.  Past medical history significant for cough variant asthma, chronic rhinitis.  Patient of Dr. 96.  08/12/2021- Interim hx  Patient presents today for an acute office visit with reports of head/chest congestion and wheezing. Symptoms started 10 days ago. She went to [facility] Friday after thanksgiving and was given steroid dose pack and promethazine-dm cough syrup. Medications helped initially but symptoms worsened shortly after stopping. She is coughing up green mucus. She is compliant with Symbicort 06-23-1972 two puff twice daily. She started taking zyretc for some associated allergy symptoms but stopped using dymista nasal spray.    Allergies  Allergen Reactions   Morphine And Related    Other     brazile and macadamia nuts ANY NARCOTIC    Propofol    Sulfa Antibiotics     Immunization History  Administered Date(s) Administered   Influenza, High Dose Seasonal PF 06/01/2017, 07/11/2018, 07/11/2018, 05/09/2019, 07/10/2020, 06/26/2021   Influenza, Seasonal, Injecte, Preservative Fre 07/30/2014   Influenza-Unspecified 06/26/2009, 06/26/2009, 06/12/2010, 06/12/2010, 05/29/2013, 05/29/2013, 07/30/2014, 07/30/2014, 06/01/2017, 07/11/2018, 07/11/2018, 05/09/2019, 05/09/2019   PFIZER Comirnaty(Gray Top)Covid-19 Tri-Sucrose Vaccine 11/20/2019, 12/11/2019, 07/10/2020   PFIZER(Purple Top)SARS-COV-2 Vaccination 11/20/2019, 12/11/2019, 07/10/2020   Pfizer Covid-19 Vaccine Bivalent Booster 71yrs & up 06/26/2021   Pneumococcal Conjugate-13 02/22/2014, 02/22/2014   Pneumococcal Polysaccharide-23 09/15/2003, 09/15/2003, 02/16/2017, 02/16/2017   Td 04/06/2013   Tdap 03/23/2017, 03/23/2017, 01/14/2019   Tetanus 04/06/2013   Zoster Recombinat (Shingrix)  11/15/2018, 11/15/2018, 04/02/2019, 04/02/2019   Zoster, Live 03/28/2014, 03/28/2014, 11/15/2018, 04/02/2019    Past Medical History:  Diagnosis Date   Asthma    Hypertension    PUD (peptic ulcer disease)    Thyroid disease     Tobacco History: Social History   Tobacco Use  Smoking Status Never  Smokeless Tobacco Never   Counseling given: Not Answered   Outpatient Medications Prior to Visit  Medication Sig Dispense Refill   Albuterol Sulfate (PROAIR HFA IN) Inhale 2 puffs into the lungs every 4 (four) hours as needed.     Azelastine-Fluticasone (DYMISTA) 137-50 MCG/ACT SUSP Place 1 puff into the nose 2 (two) times daily. 23 g 11   b complex vitamins tablet Take 1 tablet by mouth daily.     budesonide-formoterol (SYMBICORT) 80-4.5 MCG/ACT inhaler INHALE 2 PUFFS BY MOUTH FIRST THING IN AM, THEN 2 PUFFS ABOUT 12 HOURS LATER 10.2 each 6   cholecalciferol (VITAMIN D3) 25 MCG (1000 UNIT) tablet Take 1,000 Units by mouth daily.     Coenzyme Q10 200 MG capsule Take 200 mg by mouth daily.     Docosahexaenoic Acid (DHA) 200 MG CAPS Take by mouth.     famotidine (PEPCID) 20 MG tablet One after bfast and one  after supper 60 tablet 5   guaiFENesin (MUCINEX) 600 MG 12 hr tablet Take 600 mg by mouth 2 (two) times daily as needed.     ibuprofen (ADVIL) 200 MG tablet Take 200 mg by mouth every 6 (six) hours as needed.     levothyroxine (SYNTHROID) 112 MCG tablet Take 112 mcg by mouth daily before breakfast.     liraglutide (VICTOZA) 18 MG/3ML SOPN Inject into the skin.     LORazepam (ATIVAN) 0.5 MG tablet Take 0.5-1 mg by mouth at bedtime.  losartan (COZAAR) 50 MG tablet Take 50 mg by mouth daily.     magnesium oxide (MAG-OX) 400 MG tablet Take 400 mg by mouth daily.     metoprolol tartrate (LOPRESSOR) 25 MG tablet TAKE 1 TABLET BY MOUTH TWICE A DAY 180 tablet 1   montelukast (SINGULAIR) 10 MG tablet TAKE 1 TABLET BY MOUTH EVERYDAY AT BEDTIME 30 tablet 0   naproxen sodium (ALEVE) 220 MG  tablet Take 220 mg by mouth daily as needed.     Nutritional Supplements (DHEA PO) Take 1 capsule by mouth daily.     promethazine-dextromethorphan (PROMETHAZINE-DM) 6.25-15 MG/5ML syrup Take 5 mLs by mouth 4 (four) times daily as needed.     sodium chloride (OCEAN) 0.65 % SOLN nasal spray Place 1 spray into both nostrils as needed for congestion.     spironolactone (ALDACTONE) 25 MG tablet Take 50 mg by mouth daily.     venlafaxine (EFFEXOR) 25 MG tablet Take 25 mg by mouth daily.     vitamin E 180 MG (400 UNITS) capsule Take 400 Units by mouth daily.     predniSONE (DELTASONE) 10 MG tablet 40mg X2 days, 20mg  X2 days, 10mg X2 days, then stop. 14 tablet 0   ketotifen (ZADITOR) 0.025 % ophthalmic solution Place 1 drop into the right eye 2 (two) times daily. (Patient not taking: Reported on 08/12/2021)     No facility-administered medications prior to visit.   Review of Systems  Review of Systems  Constitutional: Negative.   HENT:  Positive for congestion.   Respiratory:  Positive for cough and wheezing. Negative for chest tightness and shortness of breath.     Physical Exam  BP 126/70 (BP Location: Left Arm, Cuff Size: Normal)   Pulse 96   Temp 98.6 F (37 C)   Ht 5' 1.5" (1.562 m)   Wt 215 lb 12.8 oz (97.9 kg)   SpO2 95%   BMI 40.11 kg/m  Physical Exam Constitutional:      General: She is not in acute distress.    Appearance: Normal appearance. She is ill-appearing.  HENT:     Right Ear: Tympanic membrane normal. There is no impacted cerumen.     Left Ear: Tympanic membrane normal. There is no impacted cerumen.     Mouth/Throat:     Mouth: Mucous membranes are moist.     Pharynx: Oropharynx is clear.  Cardiovascular:     Rate and Rhythm: Normal rate and regular rhythm.  Pulmonary:     Effort: Pulmonary effort is normal.     Breath sounds: Wheezing present.  Musculoskeletal:        General: Normal range of motion.  Skin:    General: Skin is warm and dry.  Neurological:      General: No focal deficit present.     Mental Status: She is alert and oriented to person, place, and time. Mental status is at baseline.  Psychiatric:        Mood and Affect: Mood normal.        Behavior: Behavior normal.        Thought Content: Thought content normal.        Judgment: Judgment normal.     Lab Results:  CBC    Component Value Date/Time   WBC 4.0 04/18/2021 1506   RBC 4.61 04/18/2021 1506   HGB 14.0 04/18/2021 1506   HCT 42.1 04/18/2021 1506   PLT 141 (L) 04/18/2021 1506   MCV 91.3 04/18/2021 1506   MCH 30.4 04/18/2021  1506   MCHC 33.3 04/18/2021 1506   RDW 13.4 04/18/2021 1506   LYMPHSABS 0.5 (L) 04/18/2021 1506   MONOABS 0.5 04/18/2021 1506   EOSABS 0.1 04/18/2021 1506   BASOSABS 0.0 04/18/2021 1506    BMET    Component Value Date/Time   NA 138 04/18/2021 1506   K 3.6 04/18/2021 1506   CL 104 04/18/2021 1506   CO2 26 04/18/2021 1506   GLUCOSE 90 04/18/2021 1506   BUN 17 04/18/2021 1506   CREATININE 0.68 04/18/2021 1506   CALCIUM 8.9 04/18/2021 1506   GFRNONAA >60 04/18/2021 1506    BNP No results found for: BNP  ProBNP No results found for: PROBNP  Imaging: No results found.   Assessment & Plan:   Asthma exacerbation - Acute asthma exacerbation secondary to sinobronchitis. Patient developed head/chest congestion, cough and wheezing 10 days ago. She had some initial improvement with steroid dose pack. She is compliant with Symbicort 2 puffs BID. Sending in RX Augmentin 1 tab twice daily x 7 days and prednisone taper (40mg  x 3 days; 30mg  x 3 days; 20mg  x 3 days; 10mg  x 3 days). Recommend she resume Dymista nasal spray and continue mucinex 600mg  twice daily. Advised patient to notify our office if symptoms do not improve in 5-7 days or worsen.    , NP 08/12/2021

## 2021-08-12 NOTE — Patient Instructions (Addendum)
Recommendations Continue Symbicort 80 mcg 2 puffs morning and evening (rinse mouth after use) Continue Dymista 1 puff per nostril twice daily Take regular Mucinex 600mg  twice daily with full glass of water Use honey lozenges to help with cough suppression   Rx: Augmentin 1 tab twice daily x 7 days (sent) Prednisone taper as directed (sent)  Follow-up: Call our office if symptoms do not improve or worsen

## 2021-08-18 ENCOUNTER — Other Ambulatory Visit: Payer: Self-pay

## 2021-08-18 ENCOUNTER — Ambulatory Visit (HOSPITAL_COMMUNITY): Payer: Medicare HMO | Attending: Cardiovascular Disease

## 2021-08-18 DIAGNOSIS — R072 Precordial pain: Secondary | ICD-10-CM | POA: Diagnosis present

## 2021-08-18 LAB — MYOCARDIAL PERFUSION IMAGING
Base ST Depression (mm): 0 mm
LV dias vol: 78 mL (ref 46–106)
LV sys vol: 27 mL
Nuc Stress EF: 65 %
Peak HR: 88 {beats}/min
Rest HR: 60 {beats}/min
Rest Nuclear Isotope Dose: 10.6 mCi
SDS: 1
SRS: 0
SSS: 1
ST Depression (mm): 0 mm
Stress Nuclear Isotope Dose: 31.5 mCi
TID: 0.98

## 2021-08-18 MED ORDER — TECHNETIUM TC 99M TETROFOSMIN IV KIT
10.6000 | PACK | Freq: Once | INTRAVENOUS | Status: AC | PRN
  Administered 2021-08-18: 10.6 via INTRAVENOUS
  Filled 2021-08-18: qty 11

## 2021-08-18 MED ORDER — REGADENOSON 0.4 MG/5ML IV SOLN
0.4000 mg | Freq: Once | INTRAVENOUS | Status: AC
  Administered 2021-08-18: 0.4 mg via INTRAVENOUS

## 2021-08-18 MED ORDER — TECHNETIUM TC 99M TETROFOSMIN IV KIT
31.5000 | PACK | Freq: Once | INTRAVENOUS | Status: AC | PRN
  Administered 2021-08-18: 31.5 via INTRAVENOUS
  Filled 2021-08-18: qty 32

## 2021-08-25 ENCOUNTER — Encounter: Payer: Self-pay | Admitting: Internal Medicine

## 2021-08-25 ENCOUNTER — Telehealth: Payer: Self-pay | Admitting: Internal Medicine

## 2021-08-25 NOTE — Telephone Encounter (Signed)
Dr Sherene Sires- please see the attachment from the pt.   I've attached a screenshot of the x-ray report.  I hope it helps. Tal Gulledge  Attachments  [id number]

## 2021-08-25 NOTE — Telephone Encounter (Signed)
LMTCB

## 2021-08-25 NOTE — Telephone Encounter (Signed)
Call returned to patient, confirmed DOB. Patient reports she went to Urgent care ([facility]) yesterday due to a dry cough that would not stop and some SOB. She was diagnosed with Pneumonia and she was given Augmentin and zpack. Since starting the medication the cough has gotten better and more productive and not so dry and her SOB is better. She is wanting to know if any further recommendations are needed. Patient has an appt 1/3 with MW.   SG please advise. Thanks

## 2021-08-25 NOTE — Telephone Encounter (Signed)
Noted.  Will close encounter.  

## 2021-08-30 ENCOUNTER — Encounter: Payer: Self-pay | Admitting: Internal Medicine

## 2021-08-30 ENCOUNTER — Encounter: Payer: Self-pay | Admitting: Cardiology

## 2021-09-01 NOTE — Telephone Encounter (Signed)
Dr. Sherene Sires pt has provided the following results for you:    I have attached a screenshot of the results of the stress test. So happy it looks OK. Have a great weekend. Sincerely, Sharell Jomarie Longs  Attachments  [id number]  Thank you. Nothing further needed at this time.

## 2021-09-15 NOTE — Progress Notes (Signed)
Jeanne Ortiz, female    DOB: June 02, 1941    MRN: [id number]   Brief patient profile:  81  yowf never smoker onset in teenager years in [place] with intermittent non-seasonal rhinitis more problem around age 81 with year round symptoms no better with sinus surgeries / allergy eval pos birch, horses cats/dog never took shots  with new onset cough/wheezing  around 2010 eval by [facility] rx alb/antihistamines then he left for [place] [facility]  and then seen  By [facility]  rec gerd rx didn't help and side effects = crampy  then nucala did not take due to cost and worse cough/sob since arrived in [facility] March 2020 so referred to pulmonary clinic 11/16/2019 by Jeanne Bushy  NP.  Living in house built in 1980s with house with crawl space/ 2 cats / golden retriever    History of Present Illness  11/16/2019  Pulmonary/ 1st office eval/Jeanne Ortiz / maint on singulair and generic advair Chief Complaint  Patient presents with   Pulmonary Consult    Referred by Jeanne Bushy, NP. Pt c/o cough for the past 8-10 years.   Dyspnea:  MMRC2 = can't walk a nl pace on a flat grade s sob but does fine slow and flat leaning on cart at HT Cough: dry raspy after supper and hs  Sleep: flat bed 2 pillows  SABA use: daily now but up to 7x per day much less since last prednisone finished  4 days  prior to OV   Has overt HB but told can't take ppi due to renal dz rec .Plan A = Automatic = Always=    Symbicort 80 Take 2 puffs first thing in am and then another 2 puffs about 12 hours later.  Work on inhaler technique:   Pepcid 20 mg one after bfast and supper  Plan B = Backup (to supplement plan A, not to replace it) Only use your albuterol(poair)  inhaler as a rescue medication Plan C = Crisis (instead of Plan B but only if Plan B stops working) - only use your albuterol nebulizer if you first try Plan B  Plan D = Deltasone - if getting worse despite above then take Prednisone 10 mg take  4 each am  x 2 days,   2 each am x 2 days,  1 each am x 2 days and stop       05/31/2020  f/u ov/Jeanne Ortiz re: asthma  maint on symbicort 80 2bid / singulair/ poor control of pnds on astelin prn and clariton d as maint  Chief Complaint  Patient presents with   Follow-up    PFT.  Cough has improved. She still has some cough that she relates to PND. She has not had to use her albuterol inhaler.   Dyspnea:  Not limited by breathing from desired activities   Cough: since august sinus infection has sense of pnds but does not typically keep her up  Sleeping: bed flat/ 2 pillows  SABA use:  None  02: none  Rec For  any nasal symptoms first try dymista one twice daily and add clariton D as needed    04/14/21 Covid and ever since then has had 2 episodes of chest tightness one week   06/09/2021  f/u ov/Jeanne Ortiz re: asthma  maint on symbicort 160 2bid  Chief Complaint  Patient presents with   Follow-up    Pt c/o heaviness in her chest over the past month. She has had to use her rescue  inhaler x 2 in the past month. She is coughing some- relates to PND- non prod.   Dyspnea:  more limited by knees/was able to vacuum one day prior s symptoms  but had 2 prior episodes of doe with gen ant chest tightness, never at rest, not prevented by taking saba or symbicort prior to ex  Cough: none  Sleeping: bed flat bed  SABA use: only used saba twice in sept 2022 02: none  Covid status:   vax x 3   Rec Ok to try albuterol 15 min before an activity (on alternating days)  that you know would usually make you short of breath  Work on inhaler technique:   Lopressor (metaprolol) 25 mg twice daily in addition to your present blood pressure medication will lower your risk of a heart attack Stay on pepcid after breakfast and supper We will be referring you for cardiology evaluation > neg myoview chemical stress test  / told Dr Jeanne Ortiz it was not related to exertion       09/16/2021  f/u ov/Jeanne Ortiz re: asthma f/u cp w/u maint on  symbicort 80 2bid  and overall better but not as good as she gets from pred rx  Chief Complaint  Patient presents with   Follow-up    Breathing is overall doing well. She has some PND and cough- non prod. She rarely uses her albuterol.   Dyspnea:  fine as long as pushing cart including [employer] or stroller / overall improved with less chest tightness  Cough: none  Sleeping: bed flat with two pillows  SABA use: none lately  02: none  Covid status:   vax x 4    No obvious day to day or daytime variability or assoc excess/ purulent sputum or mucus plugs or hemoptysis or cp or chest tightness, subjective wheeze or overt sinus or hb symptoms.   Sleeping now  without nocturnal  or early am exacerbation  of respiratory  c/o's or need for noct saba. Also denies any obvious fluctuation of symptoms with weather or environmental changes or other aggravating or alleviating factors except as outlined above   No unusual exposure hx or h/o childhood pna/ asthma or knowledge of premature birth.  Current Allergies, Complete Past Medical History, Past Surgical History, Family History, and Social History were reviewed in [employer] record.  ROS  The following are not active complaints unless bolded Hoarseness, sore throat, dysphagia, dental problems, itching, sneezing,  nasal congestion or discharge of excess mucus or purulent secretions, ear ache,   fever, chills, sweats, unintended wt loss or wt gain, classically pleuritic or exertional cp,  orthopnea pnd or arm/hand swelling  or leg swelling/ dependent/ resolves overnight , presyncope, palpitations, abdominal pain, anorexia, nausea, vomiting, diarrhea  or change in bowel habits or change in bladder habits, change in stools or change in urine, dysuria, hematuria,  rash, arthralgias, visual complaints, headache, numbness, weakness or ataxia or problems with walking or coordination,  change in mood or  memory.        Current Meds   Medication Sig   Albuterol Sulfate (PROAIR HFA IN) Inhale 2 puffs into the lungs every 4 (four) hours as needed.   Azelastine-Fluticasone (DYMISTA) 137-50 MCG/ACT SUSP Place 1 puff into the nose 2 (two) times daily.   b complex vitamins tablet Take 1 tablet by mouth daily.   budesonide-formoterol (SYMBICORT) 80-4.5 MCG/ACT inhaler INHALE 2 PUFFS BY MOUTH FIRST THING IN AM, THEN 2 PUFFS ABOUT 12 HOURS LATER  cholecalciferol (VITAMIN D3) 25 MCG (1000 UNIT) tablet Take 1,000 Units by mouth daily.   Coenzyme Q10 200 MG capsule Take 200 mg by mouth daily.   Docosahexaenoic Acid (DHA) 200 MG CAPS Take by mouth.   famotidine (PEPCID) 20 MG tablet One after bfast and one  after supper   guaiFENesin (MUCINEX) 600 MG 12 hr tablet Take 600 mg by mouth 2 (two) times daily as needed.   ibuprofen (ADVIL) 200 MG tablet Take 200 mg by mouth every 6 (six) hours as needed.   ketotifen (ZADITOR) 0.025 % ophthalmic solution Place 1 drop into the right eye 2 (two) times daily.   levothyroxine (SYNTHROID) 112 MCG tablet Take 112 mcg by mouth daily before breakfast.   liraglutide (VICTOZA) 18 MG/3ML SOPN Inject into the skin.   LORazepam (ATIVAN) 0.5 MG tablet Take 0.5-1 mg by mouth at bedtime.   losartan (COZAAR) 50 MG tablet Take 50 mg by mouth daily.   magnesium oxide (MAG-OX) 400 MG tablet Take 400 mg by mouth daily.   metoprolol tartrate (LOPRESSOR) 25 MG tablet TAKE 1 TABLET BY MOUTH TWICE A DAY   montelukast (SINGULAIR) 10 MG tablet TAKE 1 TABLET BY MOUTH EVERYDAY AT BEDTIME   naproxen sodium (ALEVE) 220 MG tablet Take 220 mg by mouth daily as needed.   Nutritional Supplements (DHEA PO) Take 1 capsule by mouth daily.   promethazine-dextromethorphan (PROMETHAZINE-DM) 6.25-15 MG/5ML syrup Take 5 mLs by mouth 4 (four) times daily as needed.   sodium chloride (OCEAN) 0.65 % SOLN nasal spray Place 1 spray into both nostrils as needed for congestion.   spironolactone (ALDACTONE) 25 MG tablet Take 50 mg by mouth  daily.   venlafaxine (EFFEXOR) 25 MG tablet Take 25 mg by mouth daily.   vitamin E 180 MG (400 UNITS) capsule Take 400 Units by mouth daily.                    Past Medical History:  Diagnosis Date   Asthma    Hypertension    Thyroid disease        Objective:   Wts   09/16/2021         224   06/09/2021      225   05/31/20 213 lb (96.6 kg)  01/26/20 217 lb (98.4 kg)  11/16/19 220 lb (99.8 kg)    Vital signs reviewed  09/16/2021  - Note at rest 02 sats  96% on RA   General appearance:    pleasant mod obese amb wf nad   HEENT : pt wearing mask not removed for exam due to covid -19 concerns.    NECK :  without JVD/Nodes/TM/ nl carotid upstrokes bilaterally   LUNGS: no acc muscle use,  Nl contour chest which is clear to A and P bilaterally without cough on insp or exp maneuvers   CV:  RRR  no s3 or murmur or increase in P2, and no edema   ABD:  soft and nontender with nl inspiratory excursion in the supine position. No bruits or organomegaly appreciated, bowel sounds nl  MS:  Nl gait/ ext warm without deformities, calf tenderness, cyanosis or clubbing No obvious joint restrictions   SKIN: warm and dry without lesions    NEURO:  alert, approp, nl sensorium with  no motor or cerebellar deficits apparent.             Assessment

## 2021-09-16 ENCOUNTER — Encounter: Payer: Self-pay | Admitting: Internal Medicine

## 2021-09-16 ENCOUNTER — Ambulatory Visit: Payer: Medicare HMO | Admitting: Internal Medicine

## 2021-09-16 ENCOUNTER — Other Ambulatory Visit: Payer: Self-pay

## 2021-09-16 DIAGNOSIS — J45991 Cough variant asthma: Secondary | ICD-10-CM

## 2021-09-16 MED ORDER — BUDESONIDE-FORMOTEROL FUMARATE 160-4.5 MCG/ACT IN AERO: INHALATION_SPRAY | RESPIRATORY_TRACT | 11 refills | Status: DC

## 2021-09-16 NOTE — Patient Instructions (Addendum)
Try change symbicort to 160 Take 2 puffs first thing in am and then another 2 puffs about 12 hours later.      Please schedule a follow up visit in 6 months but call sooner if needed

## 2021-09-16 NOTE — Assessment & Plan Note (Addendum)
Onset around 2010 in pt with allergic rhinitis - allergy testing ? 2010  [place]  Pos multiple sources including dogs / cat / trees  - 11/16/2019   try symbicort 80 2bid instead of wixella (DPI) > much better - 01/26/2020  After extensive coaching inhaler device,  effectiveness =    90% from baseline 75% - PFT's  05/31/2020  FEV1 1.96 (109 % ) ratio 0.78  p 9 % improvement from saba p symbicort 80 x 2 puffs  prior to study with DLCO  23.79 (135%) corrects to 4.85 (116%)  for alv volume and FV curve min concave   - 09/16/2021  After extensive coaching inhaler device,  effectiveness =    80% > try symbicort 160 2bid x one month to see if any better vs symb 80   Assoc with pnds better with dymista so may not see much improvement on the 160 and if not can go back to the 80 2bid p one month trial  If better f/u in 6 m, if not f/u sooner to consider empirically treating more aggressively for gerd (as has atypical cp also) or repeat the allergy w/u with option for biologics   Discussed in detail all the  indications, usual  risks and alternatives  relative to the benefits with patient who agrees to proceed with Rx as outlined.             Each maintenance medication was reviewed in detail including emphasizing most importantly the difference between maintenance and prns and under what circumstances the prns are to be triggered using an action plan format where appropriate.  Total time for H and P, chart review, counseling, reviewing hfa device(s) and generating customized AVS unique to this office visit / same day charting = 24 min

## 2021-09-19 NOTE — Telephone Encounter (Signed)
It is not a normal cxr whereas baseline here was nl so would rec at least 6 weeks p the one she sent (I don't see the date) she needs a repeat here with ov as well if not back to baseline in terms of symptoms

## 2022-01-11 ENCOUNTER — Other Ambulatory Visit: Payer: Self-pay | Admitting: Internal Medicine

## 2022-01-11 DIAGNOSIS — R079 Chest pain, unspecified: Secondary | ICD-10-CM

## 2022-02-18 ENCOUNTER — Other Ambulatory Visit: Payer: Self-pay | Admitting: Family Medicine

## 2022-02-18 DIAGNOSIS — Z1231 Encounter for screening mammogram for malignant neoplasm of breast: Secondary | ICD-10-CM

## 2022-02-18 DIAGNOSIS — M8580 Other specified disorders of bone density and structure, unspecified site: Secondary | ICD-10-CM

## 2022-05-14 ENCOUNTER — Emergency Department (HOSPITAL_BASED_OUTPATIENT_CLINIC_OR_DEPARTMENT_OTHER): Payer: Medicare HMO

## 2022-05-14 ENCOUNTER — Other Ambulatory Visit (HOSPITAL_BASED_OUTPATIENT_CLINIC_OR_DEPARTMENT_OTHER): Payer: Self-pay

## 2022-05-14 ENCOUNTER — Emergency Department (HOSPITAL_BASED_OUTPATIENT_CLINIC_OR_DEPARTMENT_OTHER)
Admission: EM | Admit: 2022-05-14 | Discharge: 2022-05-14 | Disposition: A | Discharge status: Home / Self Care | Payer: Medicare HMO | Attending: Emergency Medicine | Admitting: Emergency Medicine

## 2022-05-14 ENCOUNTER — Encounter (HOSPITAL_BASED_OUTPATIENT_CLINIC_OR_DEPARTMENT_OTHER): Payer: Self-pay | Admitting: Emergency Medicine

## 2022-05-14 ENCOUNTER — Other Ambulatory Visit: Payer: Self-pay

## 2022-05-14 DIAGNOSIS — R112 Nausea with vomiting, unspecified: Secondary | ICD-10-CM

## 2022-05-14 DIAGNOSIS — R42 Dizziness and giddiness: Secondary | ICD-10-CM | POA: Insufficient documentation

## 2022-05-14 DIAGNOSIS — R197 Diarrhea, unspecified: Secondary | ICD-10-CM | POA: Insufficient documentation

## 2022-05-14 DIAGNOSIS — R0981 Nasal congestion: Secondary | ICD-10-CM

## 2022-05-14 DIAGNOSIS — Z79899 Other long term (current) drug therapy: Secondary | ICD-10-CM | POA: Insufficient documentation

## 2022-05-14 LAB — CBC WITH DIFFERENTIAL/PLATELET
Abs Immature Granulocytes: 0.05 10*3/uL (ref 0.00–0.07)
Basophils Absolute: 0 10*3/uL (ref 0.0–0.1)
Basophils Relative: 0 %
Eosinophils Absolute: 0.3 10*3/uL (ref 0.0–0.5)
Eosinophils Relative: 4 %
HCT: 39.2 % (ref 36.0–46.0)
Hemoglobin: 13.2 g/dL (ref 12.0–15.0)
Immature Granulocytes: 1 %
Lymphocytes Relative: 29 %
Lymphs Abs: 2 10*3/uL (ref 0.7–4.0)
MCH: 30.1 pg (ref 26.0–34.0)
MCHC: 33.7 g/dL (ref 30.0–36.0)
MCV: 89.5 fL (ref 80.0–100.0)
Monocytes Absolute: 0.4 10*3/uL (ref 0.1–1.0)
Monocytes Relative: 6 %
Neutro Abs: 4.1 10*3/uL (ref 1.7–7.7)
Neutrophils Relative %: 60 %
Platelets: 164 10*3/uL (ref 150–400)
RBC: 4.38 MIL/uL (ref 3.87–5.11)
RDW: 12.4 % (ref 11.5–15.5)
WBC: 6.9 10*3/uL (ref 4.0–10.5)
nRBC: 0 % (ref 0.0–0.2)

## 2022-05-14 LAB — COMPREHENSIVE METABOLIC PANEL
ALT: 11 U/L (ref 0–44)
AST: 15 U/L (ref 15–41)
Albumin: 4.2 g/dL (ref 3.5–5.0)
Alkaline Phosphatase: 40 U/L (ref 38–126)
Anion gap: 12 (ref 5–15)
BUN: 23 mg/dL (ref 8–23)
CO2: 22 mmol/L (ref 22–32)
Calcium: 9.6 mg/dL (ref 8.9–10.3)
Chloride: 104 mmol/L (ref 98–111)
Creatinine, Ser: 0.67 mg/dL (ref 0.44–1.00)
GFR, Estimated: >60 mL/min (ref >60)
Glucose, Bld: 142 mg/dL — ABNORMAL HIGH (ref 70–99)
Potassium: 3.9 mmol/L (ref 3.5–5.1)
Sodium: 138 mmol/L (ref 135–145)
Total Bilirubin: 0.6 mg/dL (ref 0.3–1.2)
Total Protein: 6.4 g/dL — ABNORMAL LOW (ref 6.5–8.1)

## 2022-05-14 LAB — LIPASE, BLOOD: Lipase: 20 U/L (ref 11–51)

## 2022-05-14 MED ORDER — DIPHENHYDRAMINE HCL 50 MG/ML IJ SOLN
12.5000 mg | Freq: Once | INTRAMUSCULAR | Status: AC
  Administered 2022-05-14: 12.5 mg via INTRAVENOUS
  Filled 2022-05-14: qty 1

## 2022-05-14 MED ORDER — SODIUM CHLORIDE 0.9 % IV BOLUS
1000.0000 mL | Freq: Once | INTRAVENOUS | Status: AC
  Administered 2022-05-14: 1000 mL via INTRAVENOUS

## 2022-05-14 MED ORDER — ONDANSETRON HCL 4 MG PO TABS
4.0000 mg | ORAL_TABLET | Freq: Four times a day (QID) | ORAL | 0 refills | Status: DC
Start: 2022-05-14 — End: 2023-10-17
  Filled 2022-05-14: qty 12, 3d supply, fill #0

## 2022-05-14 MED ORDER — ONDANSETRON HCL 4 MG/2ML IJ SOLN
4.0000 mg | Freq: Once | INTRAMUSCULAR | Status: AC
  Administered 2022-05-14: 4 mg via INTRAVENOUS
  Filled 2022-05-14: qty 2

## 2022-05-14 MED ORDER — DEXAMETHASONE SODIUM PHOSPHATE 10 MG/ML IJ SOLN
10.0000 mg | Freq: Once | INTRAMUSCULAR | Status: AC
Start: 2022-05-14 — End: 2022-05-14
  Administered 2022-05-14: 10 mg via INTRAVENOUS
  Filled 2022-05-14: qty 1

## 2022-05-14 MED ORDER — FENTANYL CITRATE PF 50 MCG/ML IJ SOSY
50.0000 ug | PREFILLED_SYRINGE | Freq: Once | INTRAMUSCULAR | Status: DC
  Filled 2022-05-14: qty 1

## 2022-05-14 NOTE — ED Provider Notes (Signed)
[facility] DEPT Provider Note   CSN: [id number] Arrival date & time: 05/14/22  [id number]     History  Chief Complaint  Patient presents with   Abdominal Pain   Dizziness    Jeanne Ortiz is a 81 y.o. female.  Has not been feeling well the last several days.  Is on Augmentin for suspected may be sinus infection.  Had negative COVID test earlier this week.  She has had diarrhea, nausea, vomiting.  Denies any fevers or chills.  Cramping pain in her belly at times.  History of abdominal hysterectomy.  Her belly pain is now improved.  She had a very mild migraine type headache which makes her feel sometimes nauseous and vomiting and lightheaded to at times.  Got Zofran in route with EMS.  Nausea vomiting diarrhea started after Augmentin.  The history is provided by the patient and the EMS personnel.  Emesis Severity:  Mild Timing:  Intermittent Able to tolerate:  Liquids Progression:  Unchanged Chronicity:  New Worsened by:  Nothing Ineffective treatments:  None tried Associated symptoms: diarrhea   Associated symptoms: no abdominal pain, no arthralgias, no chills, no cough, no fever, no headaches, no myalgias, no sore throat and no URI   Risk factors: prior abdominal surgery        Home Medications Prior to Admission medications   Medication Sig Start Date End Date Taking? Authorizing Provider  ondansetron (ZOFRAN) 4 MG tablet Take 1 tablet (4 mg total) by mouth every 6 (six) hours. 05/14/22  Yes Azayla Polo, DO  Albuterol Sulfate (PROAIR HFA IN) Inhale 2 puffs into the lungs every 4 (four) hours as needed.    [provider]  Azelastine-Fluticasone (DYMISTA) 137-50 MCG/ACT SUSP Place 1 puff into the nose 2 (two) times daily. 05/31/20   Tanda Rockers, MD  b complex vitamins tablet Take 1 tablet by mouth daily.    [provider]  budesonide-formoterol (SYMBICORT) 160-4.5 MCG/ACT inhaler Take 2 puffs first thing in am and then another  2 puffs about 12 hours later. 09/16/21   Tanda Rockers, MD  cholecalciferol (VITAMIN D3) 25 MCG (1000 UNIT) tablet Take 1,000 Units by mouth daily.    [provider]  Coenzyme Q10 200 MG capsule Take 200 mg by mouth daily.    [provider]  Docosahexaenoic Acid (DHA) 200 MG CAPS Take by mouth.    [provider]  famotidine (PEPCID) 20 MG tablet One after bfast and one  after supper 10/16/20   Tanda Rockers, MD  guaiFENesin (MUCINEX) 600 MG 12 hr tablet Take 600 mg by mouth 2 (two) times daily as needed.    [provider]  ibuprofen (ADVIL) 200 MG tablet Take 200 mg by mouth every 6 (six) hours as needed.    [provider]  ketotifen (ZADITOR) 0.025 % ophthalmic solution Place 1 drop into the right eye 2 (two) times daily.    [provider]  levothyroxine (SYNTHROID) 112 MCG tablet Take 112 mcg by mouth daily before breakfast.    [provider]  liraglutide (VICTOZA) 18 MG/3ML SOPN Inject into the skin.    [provider]  LORazepam (ATIVAN) 0.5 MG tablet Take 0.5-1 mg by mouth at bedtime.    [provider]  losartan (COZAAR) 50 MG tablet Take 50 mg by mouth daily.    [provider]  magnesium oxide (MAG-OX) 400 MG tablet Take 400 mg by mouth daily.    [provider]  metoprolol tartrate (LOPRESSOR) 25 MG tablet TAKE 1 TABLET BY MOUTH TWICE A DAY 07/27/21   Nyoka Cowden, MD  montelukast (SINGULAIR) 10 MG tablet TAKE 1 TABLET BY MOUTH EVERYDAY AT BEDTIME 06/03/21   Nyoka Cowden, MD  naproxen sodium (ALEVE) 220 MG tablet Take 220 mg by mouth daily as needed.    [provider]  Nutritional Supplements (DHEA PO) Take 1 capsule by mouth daily.    [provider]  promethazine-dextromethorphan (PROMETHAZINE-DM) 6.25-15 MG/5ML syrup Take 5 mLs by mouth 4 (four) times daily as needed. 08/08/21   [provider]  sodium chloride (OCEAN) 0.65 % SOLN nasal spray Place 1  spray into both nostrils as needed for congestion.    [provider]  spironolactone (ALDACTONE) 25 MG tablet Take 50 mg by mouth daily.    [provider]  venlafaxine (EFFEXOR) 25 MG tablet Take 25 mg by mouth daily.    [provider]  vitamin E 180 MG (400 UNITS) capsule Take 400 Units by mouth daily.    [provider]      Allergies    Morphine and related, Other, Propofol, and Sulfa antibiotics    Review of Systems   Review of Systems  Constitutional:  Negative for chills and fever.  HENT:  Negative for sore throat.   Respiratory:  Negative for cough.   Gastrointestinal:  Positive for diarrhea and vomiting. Negative for abdominal pain.  Musculoskeletal:  Negative for arthralgias and myalgias.  Neurological:  Negative for headaches.    Physical Exam Updated Vital Signs BP (!) 154/71   Pulse 74   Temp 97.7 F (36.5 C) (Oral)   Resp 20   Ht 5\' 1"  (1.549 m)   Wt 99.8 kg   SpO2 93%   BMI 41.57 kg/m  Physical Exam Vitals and nursing note reviewed.  Constitutional:      General: She is not in acute distress.    Appearance: She is well-developed. She is not ill-appearing.     Comments: Uncomfortable   HENT:     Head: Normocephalic and atraumatic.  Eyes:     Conjunctiva/sclera: Conjunctivae normal.  Cardiovascular:     Rate and Rhythm: Normal rate and regular rhythm.     Heart sounds: No murmur heard. Pulmonary:     Effort: Pulmonary effort is normal. No respiratory distress.     Breath sounds: Normal breath sounds.  Abdominal:     Palpations: Abdomen is soft.     Tenderness: There is no abdominal tenderness.  Musculoskeletal:        General: No swelling.     Cervical back: Neck supple.  Skin:    General: Skin is warm and dry.     Capillary Refill: Capillary refill takes less than 2 seconds.  Neurological:     General: No focal deficit present.     Mental Status: She is alert and oriented to person, place, and time.      Cranial Nerves: No cranial nerve deficit.     Motor: No weakness.     Comments: 5+ out of 5 strength throughout, normal sensation, no drift, normal finger-nose-finger, normal speech  Psychiatric:        Mood and Affect: Mood normal.     ED Results / Procedures / Treatments   Labs (all labs ordered are listed, but only abnormal results are displayed) Labs Reviewed  COMPREHENSIVE METABOLIC PANEL - Abnormal; Notable for the following components:  Result Value   Glucose, Bld 142 (*)    Total Protein 6.4 (*)    All other components within normal limits  CBC WITH DIFFERENTIAL/PLATELET  LIPASE, BLOOD    EKG EKG Interpretation  Date/Time:  Thursday May 14 2022 09:31:17 EDT Ventricular Rate:  77 PR Interval:  171 QRS Duration: 99 QT Interval:  414 QTC Calculation: 469 R Axis:   68 Text Interpretation: Sinus rhythm Confirmed by Virgina Norfolk ([id number]) on 05/14/2022 9:39:50 AM  Radiology CT Head Wo Contrast  Result Date: 05/14/2022 CLINICAL DATA:  Headache.  Dizziness.  Nausea and vomiting. EXAM: CT HEAD WITHOUT CONTRAST TECHNIQUE: Contiguous axial images were obtained from the base of the skull through the vertex without intravenous contrast. RADIATION DOSE REDUCTION: This exam was performed according to the departmental dose-optimization program which includes automated exposure control, adjustment of the mA and/or kV according to patient size and/or use of iterative reconstruction technique. COMPARISON:  None Available. FINDINGS: Brain: There is no evidence for acute hemorrhage, hydrocephalus, mass lesion, or abnormal extra-axial fluid collection. No definite CT evidence for acute infarction. Diffuse loss of parenchymal volume is consistent with atrophy. Patchy low attenuation in the deep hemispheric and periventricular white matter is nonspecific, but likely reflects chronic microvascular ischemic demyelination. Vascular: No hyperdense vessel or unexpected calcification. Skull: No  evidence for fracture. No worrisome lytic or sclerotic lesion. Sinuses/Orbits: The visualized paranasal sinuses and mastoid air cells are clear. Visualized portions of the globes and intraorbital fat are unremarkable. Other: 9. IMPRESSION: 1. No acute intracranial abnormality. 2. Atrophy with chronic small vessel ischemic disease. Electronically Signed   By: Kennith Center M.D.   On: 05/14/2022 10:30    Procedures Procedures    Medications Ordered in ED Medications  dexamethasone (DECADRON) injection 10 mg (has no administration in time range)  sodium chloride 0.9 % bolus 1,000 mL (1,000 mLs Intravenous New Bag/Given 05/14/22 0940)  ondansetron (ZOFRAN) injection 4 mg (4 mg Intravenous Given 05/14/22 0939)  diphenhydrAMINE (BENADRYL) injection 12.5 mg (12.5 mg Intravenous Given 05/14/22 8841)    ED Course/ Medical Decision Making/ A&P                           Medical Decision Making Amount and/or Complexity of Data Reviewed Labs: ordered. Radiology: ordered.  Risk Prescription drug management.   Renlee K Ramthun is here with nausea, vomiting, diarrhea.  Recently started on Augmentin for suspected sinusitis.  Had negative COVID test earlier this week.  Differential diagnosis is medication side effect versus viral process versus foodborne process.  Have low suspicion for bowel obstruction however may need to consider this.  We will get basic labs including CBC, CMP, lipase, fluid bolus, Zofran.  We will try to send C. difficile panel if she is able to produce sample.  But that is possible given recent usage of Augmentin.  Will get head CT given headache and dizziness.  Low suspicion for head bleed.  Have no concern for stroke.  This could be migraine related event/vertigo type event as well.  We will give a dose of Benadryl with Zofran.  Patient feeling much better after medications.  Per my review and interpretation of labs is no significant anemia, electrolyte abnormality, kidney injury or  leukocytosis.  Head CT is unremarkable.  EKG shows sinus rhythm per my review and interpretation.  She has been ambulatory without much issues.  We will give her a dose of Decadron I suspect this could be  vertigo/sinus related discomfort.  States that this has happened to her in the past when she gets bad sinus congestion.  Overall improved and will have her follow-up with primary care doctor.  Return precautions given.  This chart was dictated using voice recognition software.  Despite best efforts to proofread,  errors can occur which can change the documentation meaning.         Final Clinical Impression(s) / ED Diagnoses Final diagnoses:  Sinus congestion  Nausea and vomiting, unspecified vomiting type    Rx / DC Orders ED Discharge Orders          Ordered    ondansetron (ZOFRAN) 4 MG tablet  Every 6 hours        05/14/22 [place], [place], DO 05/14/22 1211

## 2022-05-14 NOTE — ED Triage Notes (Addendum)
Pt arrived via EMS, c/o of dizziness, diarrhea, N/V and abdominal pain that started yesterday. Pt report several episodes of emesis.  EMS gave 4 of zofran and a breathing treatment enroute

## 2022-05-14 NOTE — ED Notes (Signed)
Ambulatory in hall with limp which she says is normal for her.  Hs some dizziness.  Note some shaking in extremities.

## 2022-05-14 NOTE — Discharge Instructions (Signed)
Take Zofran as needed for nausea and vomiting.  I have treated you with a long-acting steroid to help with your nasal congestion.  Suspect may be some dizziness and symptoms related to sinus congestion and vertigo.  If you do not feel like you are improving please return for evaluation or follow-up with your primary care doctor.

## 2022-05-14 NOTE — ED Notes (Signed)
Patient verbalizes understanding of discharge instructions. Opportunity for questioning and answers were provided. Patient discharged from ED.  °

## 2022-07-31 ENCOUNTER — Ambulatory Visit
Admission: RE | Admit: 2022-07-31 | Discharge: 2022-07-31 | Disposition: A | Discharge status: Home / Self Care | Payer: Medicare HMO | Source: Ambulatory Visit | Attending: Family Medicine | Admitting: Family Medicine

## 2022-07-31 DIAGNOSIS — M8580 Other specified disorders of bone density and structure, unspecified site: Secondary | ICD-10-CM

## 2022-07-31 DIAGNOSIS — Z1231 Encounter for screening mammogram for malignant neoplasm of breast: Secondary | ICD-10-CM

## 2022-08-13 ENCOUNTER — Ambulatory Visit: Payer: Medicare HMO | Admitting: Internal Medicine

## 2022-08-13 ENCOUNTER — Encounter: Payer: Self-pay | Admitting: Internal Medicine

## 2022-08-13 VITALS — BP 124/68 | HR 70 | Temp 98.2°F | Ht 60.5 in | Wt 222.6 lb

## 2022-08-13 DIAGNOSIS — J45991 Cough variant asthma: Secondary | ICD-10-CM

## 2022-08-13 MED ORDER — METHYLPREDNISOLONE ACETATE 80 MG/ML IJ SUSP
120.0000 mg | Freq: Once | INTRAMUSCULAR | Status: AC
  Administered 2022-08-13: 120 mg via INTRAMUSCULAR

## 2022-08-13 NOTE — Progress Notes (Signed)
Jeanne Ortiz, female    DOB: 11/27/1940    MRN: [id number]   Brief patient profile:  81  yowf never smoker onset in teenager years in [place] with intermittent non-seasonal rhinitis more problem around age 81 with year round symptoms no better with sinus surgeries / allergy eval pos birch, horses cats/dog never took shots  with new onset cough/wheezing  around 2010 eval by [facility] rx alb/antihistamines then he left for [place] [facility]  and then seen  By [facility]  rec gerd rx didn't help and side effects = crampy  then nucala did not take due to cost and worse cough/sob since arrived in [facility] March 2020 so referred to pulmonary clinic 11/16/2019 by [name]  NP.  Living in house built in 1980s with house with crawl space/ 2 cats / golden retriever    History of Present Illness  11/16/2019  Pulmonary/ 1st office eval/Midas Daughety / maint on singulair and generic advair Chief Complaint  Patient presents with   Pulmonary Consult    Referred by [name], NP. Pt c/o cough for the past 8-10 years.   Dyspnea:  MMRC2 = can't walk a nl pace on a flat grade s sob but does fine slow and flat leaning on cart at HT Cough: dry raspy after supper and hs  Sleep: flat bed 2 pillows  SABA use: daily now but up to 7x per day much less since last prednisone finished  4 days  prior to OV   Has overt HB but told can't take ppi due to renal dz rec .Plan A = Automatic = Always=    Symbicort 80 Take 2 puffs first thing in am and then another 2 puffs about 12 hours later.  Work on inhaler technique:   Pepcid 20 mg one after bfast and supper  Plan B = Backup (to supplement plan A, not to replace it) Only use your albuterol(poair)  inhaler as a rescue medication Plan C = Crisis (instead of Plan B but only if Plan B stops working) - only use your albuterol nebulizer if you first try Plan B  Plan D = Deltasone - if getting worse despite above then take Prednisone 10 mg take  4 each am  x 2 days,   2 each am x 2 days,  1 each am x 2 days and stop       05/31/2020  f/u ov/Basem Yannuzzi re: asthma  maint on symbicort 80 2bid / singulair/ poor control of pnds on astelin prn and clariton d as maint  Chief Complaint  Patient presents with   Follow-up    PFT.  Cough has improved. She still has some cough that she relates to PND. She has not had to use her albuterol inhaler.   Dyspnea:  Not limited by breathing from desired activities   Cough: since august sinus infection has sense of pnds but does not typically keep her up  Sleeping: bed flat/ 2 pillows  SABA use:  None  02: none  Rec For  any nasal symptoms first try dymista one twice daily and add clariton D as needed    04/14/21 Covid and ever since then has had 2 episodes of chest tightness one week   06/09/2021  f/u ov/Maude Gloor re: asthma  maint on symbicort 160 2bid  Chief Complaint  Patient presents with   Follow-up    Pt c/o heaviness in her chest over the past month. She has had to use her rescue  inhaler x 2 in the past month. She is coughing some- relates to PND- non prod.   Dyspnea:  more limited by knees/was able to vacuum one day prior s symptoms  but had 2 prior episodes of doe with gen ant chest tightness, never at rest, not prevented by taking saba or symbicort prior to ex  Cough: none  Sleeping: bed flat bed  SABA use: only used saba twice in sept 2022 02: none  Covid status:   vax x 3   Rec Ok to try albuterol 15 min before an activity (on alternating days)  that you know would usually make you short of breath  Work on inhaler technique:   Lopressor (metaprolol) 25 mg twice daily in addition to your present blood pressure medication will lower your risk of a heart attack Stay on pepcid after breakfast and supper We will be referring you for cardiology evaluation > neg myoview chemical stress test  / told Dr Stanford Breed it was not related to exertion       09/16/2021  f/u ov/Chrisma Hurlock re: asthma f/u cp w/u maint on  symbicort 80 2bid  and overall better but not as good as she gets from pred rx  Chief Complaint  Patient presents with   Follow-up    Breathing is overall doing well. She has some PND and cough- non prod. She rarely uses her albuterol.   Dyspnea:  fine as long as pushing cart including [employer] or stroller / overall improved with less chest tightness  Cough: none  Sleeping: bed flat with two pillows  SABA use: none lately  02: none  Covid status:   vax x 4  Rec Try change symbicort to 160 Take 2 puffs first thing in am and then another 2 puffs about 12 hours later.       08/13/2022 acute  ov/Obryan Radu re: asthma  maint on symbicort 160   Chief Complaint  Patient presents with   Acute Visit    Pneumonia at Urgent care 1wk. Ago, sob, cough-pale yellow now, no fever  Dyspnea:  was doing fine until acutely worse Jul 03 2022 rx by uc with zpak/ pred/ amox and some better but chest still feels tight , better p saba  Cough: very pale now just in am  Sleeping: no resp cc flat bed 2 pillows  SABA use: 2-3 x per day  02: none     No obvious day to day or daytime variability or assoc excess/ purulent sputum or mucus plugs or hemoptysis or cp or   or overt sinus or hb symptoms.   Sleeping  without nocturnal  or early am exacerbation  of respiratory  c/o's or need for noct saba. Also denies any obvious fluctuation of symptoms with weather or environmental changes or other aggravating or alleviating factors except as outlined above   No unusual exposure hx or h/o childhood pna/ asthma or knowledge of premature birth.  Current Allergies, Complete Past Medical History, Past Surgical History, Family History, and Social History were reviewed in [employer] record.  ROS  The following are not active complaints unless bolded Hoarseness, sore throat, dysphagia(NOT  taking any gerd rx) , dental problems, itching, sneezing,  nasal congestion or discharge of excess mucus or purulent  secretions, ear ache,   fever, chills, sweats, unintended wt loss or wt gain, classically pleuritic or exertional cp,  orthopnea pnd or arm/hand swelling  or leg swelling, presyncope, palpitations, abdominal pain, anorexia, nausea, vomiting, diarrhea  or change in bowel habits or change in bladder habits, change in stools or change in urine, dysuria, hematuria,  rash, arthralgias, visual complaints, headache, numbness, weakness or ataxia or problems with walking or coordination,  change in mood or  memory.        Current Meds  Medication Sig   Albuterol Sulfate (PROAIR HFA IN) Inhale 2 puffs into the lungs every 4 (four) hours as needed.   amoxicillin-clavulanate (AUGMENTIN) 875-125 MG tablet Take 875 mg by mouth 2 (two) times daily.   Azelastine-Fluticasone (DYMISTA) 137-50 MCG/ACT SUSP Place 1 puff into the nose 2 (two) times daily.   b complex vitamins tablet Take 1 tablet by mouth daily.   budesonide-formoterol (SYMBICORT) 160-4.5 MCG/ACT inhaler Take 2 puffs first thing in am and then another 2 puffs about 12 hours later.   cholecalciferol (VITAMIN D3) 25 MCG (1000 UNIT) tablet Take 1,000 Units by mouth daily.   Coenzyme Q10 200 MG capsule Take 200 mg by mouth daily.   Docosahexaenoic Acid (DHA) 200 MG CAPS Take by mouth.   famotidine (PEPCID) 20 MG tablet One after bfast and one  after supper   guaiFENesin (MUCINEX) 600 MG 12 hr tablet Take 600 mg by mouth 2 (two) times daily as needed.   ibuprofen (ADVIL) 200 MG tablet Take 200 mg by mouth every 6 (six) hours as needed.   ketotifen (ZADITOR) 0.025 % ophthalmic solution Place 1 drop into the right eye 2 (two) times daily.   levothyroxine (SYNTHROID) 112 MCG tablet Take 112 mcg by mouth daily before breakfast.   liraglutide (VICTOZA) 18 MG/3ML SOPN Inject into the skin.   LORazepam (ATIVAN) 0.5 MG tablet Take 0.5-1 mg by mouth at bedtime.   losartan (COZAAR) 50 MG tablet Take 50 mg by mouth daily.   magnesium oxide (MAG-OX) 400 MG tablet  Take 400 mg by mouth daily.   metoprolol tartrate (LOPRESSOR) 25 MG tablet TAKE 1 TABLET BY MOUTH TWICE A DAY   montelukast (SINGULAIR) 10 MG tablet TAKE 1 TABLET BY MOUTH EVERYDAY AT BEDTIME   naproxen sodium (ALEVE) 220 MG tablet Take 220 mg by mouth daily as needed.   Nutritional Supplements (DHEA PO) Take 1 capsule by mouth daily.   ondansetron (ZOFRAN) 4 MG tablet Take 1 tablet (4 mg total) by mouth every 6 (six) hours.   promethazine-dextromethorphan (PROMETHAZINE-DM) 6.25-15 MG/5ML syrup Take 5 mLs by mouth 4 (four) times daily as needed.   sodium chloride (OCEAN) 0.65 % SOLN nasal spray Place 1 spray into both nostrils as needed for congestion.   spironolactone (ALDACTONE) 25 MG tablet Take 50 mg by mouth daily.   venlafaxine (EFFEXOR) 25 MG tablet Take 25 mg by mouth daily.   vitamin E 180 MG (400 UNITS) capsule Take 400 Units by mouth daily.        Past Medical History:  Diagnosis Date   Asthma    Hypertension    Thyroid disease        Objective:   Wts   08/13/2022     222   09/16/2021         224   06/09/2021      225   05/31/20 213 lb (96.6 kg)  01/26/20 217 lb (98.4 kg)  11/16/19 220 lb (99.8 kg)     Vital signs reviewed  08/13/2022  - Note at rest 02 sats  98% on RA   General appearance:    amb mod obese pleasant wf nad   HEENT : Oropharynx  clear     Nasal turbinates mod non-specific edema   NECK :  without  apparent JVD/ palpable Nodes/TM    LUNGS: no acc muscle use,  Nl contour chest with prominent upper airway cough/  wheeze and musical rhonchi bilaterally   CV:  RRR  no s3 or murmur or increase in P2, and no edema   ABD:  soft and nontender    MS:  Nl gait/ ext warm without deformities Or obvious joint restrictions  calf tenderness, cyanosis or clubbing    SKIN: warm and dry without lesions    NEURO:  alert, approp, nl sensorium with  no motor or cerebellar deficits apparent.      Assessment

## 2022-08-13 NOTE — Patient Instructions (Addendum)
Plan A = Automatic = Always=    Symbicort 160 or dulera 200 Take 2 puffs first thing in am and then another 2 puffs about 12 hours later.     Plan B = Backup (to supplement plan A, not to replace it) Only use your albuterol inhaler as a rescue medication to be used if you can't catch your breath by resting or doing a relaxed purse lip breathing pattern.  - The less you use it, the better it will work when you need it. - Ok to use the inhaler up to 2 puffs  every 4 hours if you must but call for appointment if use goes up over your usual need - Don't leave home without it !!  (think of it like the spare tire for your car)   Also  Ok to try albuterol 15 min before an activity (on alternating days)  that you know would usually make you short of breath and see if it makes any difference and if makes none then don't take albuterol after activity unless you can't catch your breath as this means it's the resting that helps, not the albuterol.      Depomedrol 120 mg IM today   For cough > mucinex dm 1200 mg every 12 hours as needed   Try prilosec otc 20mg   Take 30-60 min before first meal of the day and Pepcid ac (famotidine) 20 mg one @  bedtime until cough is completely gone for at least a week without the need for cough suppression  GERD (REFLUX)  is an extremely common cause of respiratory symptoms just like yours , many times with no obvious heartburn at all.    It can be treated with medication, but also with lifestyle changes including elevation of the head of your bed (ideally with 6 -8inch blocks under the headboard of your bed),  Smoking cessation, avoidance of late meals, excessive alcohol, and avoid fatty foods, chocolate, peppermint, colas, red wine, and acidic juices such as orange juice.  NO MINT OR MENTHOL PRODUCTS SO NO COUGH DROPS  USE SUGARLESS CANDY INSTEAD (Jolley ranchers or Stover's or Life Savers) or even ice chips will also do - the key is to swallow to prevent all throat  clearing. NO OIL BASED VITAMINS - use powdered substitutes.  Avoid fish oil when coughing.       Please schedule a follow up office visit in 2 weeks, sooner if needed

## 2022-08-14 ENCOUNTER — Encounter: Payer: Self-pay | Admitting: Internal Medicine

## 2022-08-14 NOTE — Assessment & Plan Note (Addendum)
Onset around 2010 in pt with allergic rhinitis - allergy testing ? 2010  in [place]  Pos multiple sources including dogs / cat / trees  - 11/16/2019   try symbicort 80 2bid instead of wixella (DPI) > much better - 01/26/2020  After extensive coaching inhaler device,  effectiveness =    90% from baseline 75% - PFT's  05/31/2020  FEV1 1.96 (109 % ) ratio 0.78  p 9 % improvement from saba p symbicort 80 x 2 puffs  prior to study with DLCO  23.79 (135%) corrects to 4.85 (116%)  for alv volume and FV curve min concave   - 09/16/2021  After extensive coaching inhaler device,  effectiveness =    80% > try symbicort 160 2bid x one month to see if any better vs symb   Flare since apparent URI despite maint rx with symbicort  160   DDX of  difficult airways management almost all start with A and  include Adherence, Ace Inhibitors, Acid Reflux, Active Sinus Disease, Alpha 1 Antitripsin deficiency, Anxiety masquerading as Airways dz,  ABPA,  Allergy(esp in young), Aspiration (esp in elderly), Adverse effects of meds,  Active smoking or vaping, A bunch of PE's (a small clot burden can't cause this syndrome unless there is already severe underlying pulm or vascular dz with poor reserve) plus two Bs  = Bronchiectasis and Beta blocker use..and one C= CHF  Adherence is always the initial "prime suspect" and is a multilayered concern that requires a "trust but verify" approach in every patient - starting with knowing how to use medications, especially inhalers, correctly, keeping up with refills and understanding the fundamental difference between maintenance and prns vs those medications only taken for a very short course and then stopped and not refilled.  - - The proper method of use, as well as anticipated side effects, of a metered-dose inhaler were discussed and demonstrated to the patient using teach back method.   ? Allergy component  > depomedrol 120 mg IM / continue high dose ics/laba (either symbicort 160/ dulera 200  depending on avaialability plus approp saba: Re SABA :  I spent extra time with pt today reviewing appropriate use of albuterol for prn use on exertion with the following points: 1) saba is for relief of sob that does not improve by walking a slower pace or resting but rather if the pt does not improve after trying this first. 2) If the pt is convinced, as many are, that saba helps recover from activity faster then it's easy to tell if this is the case by re-challenging : ie stop, take the inhaler, then p 5 minutes try the exact same activity (intensity of workload) that just caused the symptoms and see if they are substantially diminished or not after saba 3) if there is an activity that reproducibly causes the symptoms, try the saba 15 min before the activity on alternate days   If in fact the saba really does help, then fine to continue to use it prn but advised may need to look closer at the maintenance regimen being used to achieve better control of airways disease with exertion.   ? Acid (or non-acid) GERD > always difficult to exclude as up to 75% of pts in some series report no assoc GI/ Heartburn symptoms> rec max (24h)  acid suppression and diet restrictions/ reviewed and instructions given in writing/ control cough with mucinex dm to prevent cyclical cough/ reviewed  ? Adverse drug effects > none  of the usual suspects listed   ? Active rhinitis/ sinusitis > s/p abx rx/ reviewed use of dymista   F/u in 2 weeks with all meds in hand using a trust but verify approach to confirm accurate Medication  Reconciliation The principal here is that until we are certain that the  patients are doing what we've asked, it makes no sense to ask them to do more.           Each maintenance medication was reviewed in detail including emphasizing most importantly the difference between maintenance and prns and under what circumstances the prns are to be triggered using an action plan format where  appropriate.  Total time for H and P, chart review, counseling, reviewing hfa device(s) and generating customized AVS unique to this ACUTE office visit / same day charting  > 30 min

## 2022-08-26 ENCOUNTER — Ambulatory Visit: Payer: Medicare HMO | Admitting: Internal Medicine

## 2022-08-26 ENCOUNTER — Encounter: Payer: Self-pay | Admitting: Internal Medicine

## 2022-08-26 ENCOUNTER — Ambulatory Visit (INDEPENDENT_AMBULATORY_CARE_PROVIDER_SITE_OTHER): Payer: Medicare HMO

## 2022-08-26 VITALS — BP 134/68 | HR 65 | Temp 98.6°F | Ht 61.5 in | Wt 219.6 lb

## 2022-08-26 DIAGNOSIS — J45991 Cough variant asthma: Secondary | ICD-10-CM

## 2022-08-26 NOTE — Progress Notes (Unsigned)
Jeanne Ortiz, female    DOB: 11/27/1940    MRN: [id number]   Brief patient profile:  81  yowf never smoker onset in teenager years in [place] with intermittent non-seasonal rhinitis more problem around age 81 with year round symptoms no better with sinus surgeries / allergy eval pos birch, horses cats/dog never took shots  with new onset cough/wheezing  around 2010 eval by [facility] rx alb/antihistamines then he left for [place] [facility]  and then seen  By [facility]  rec gerd rx didn't help and side effects = crampy  then nucala did not take due to cost and worse cough/sob since arrived in [facility] March 2020 so referred to pulmonary clinic 11/16/2019 by [name]  NP.  Living in house built in 1980s with house with crawl space/ 2 cats / golden retriever    History of Present Illness  11/16/2019  Pulmonary/ 1st office eval/[name] / maint on singulair and generic advair Chief Complaint  Patient presents with   Pulmonary Consult    Referred by [name], NP. Pt c/o cough for the past 8-10 years.   Dyspnea:  MMRC2 = can't walk a nl pace on a flat grade s sob but does fine slow and flat leaning on cart at HT Cough: dry raspy after supper and hs  Sleep: flat bed 2 pillows  SABA use: daily now but up to 7x per day much less since last prednisone finished  4 days  prior to OV   Has overt HB but told can't take ppi due to renal dz rec .Plan A = Automatic = Always=    Symbicort 80 Take 2 puffs first thing in am and then another 2 puffs about 12 hours later.  Work on inhaler technique:   Pepcid 20 mg one after bfast and supper  Plan B = Backup (to supplement plan A, not to replace it) Only use your albuterol(poair)  inhaler as a rescue medication Plan C = Crisis (instead of Plan B but only if Plan B stops working) - only use your albuterol nebulizer if you first try Plan B  Plan D = Deltasone - if getting worse despite above then take Prednisone 10 mg take  4 each am  x 2 days,   2 each am x 2 days,  1 each am x 2 days and stop       05/31/2020  f/u ov/[name] re: asthma  maint on symbicort 80 2bid / singulair/ poor control of pnds on astelin prn and clariton d as maint  Chief Complaint  Patient presents with   Follow-up    PFT.  Cough has improved. She still has some cough that she relates to PND. She has not had to use her albuterol inhaler.   Dyspnea:  Not limited by breathing from desired activities   Cough: since august sinus infection has sense of pnds but does not typically keep her up  Sleeping: bed flat/ 2 pillows  SABA use:  None  02: none  Rec For  any nasal symptoms first try dymista one twice daily and add clariton D as needed    04/14/21 Covid and ever since then has had 2 episodes of chest tightness one week   06/09/2021  f/u ov/[name] re: asthma  maint on symbicort 160 2bid  Chief Complaint  Patient presents with   Follow-up    Pt c/o heaviness in her chest over the past month. She has had to use her rescue  inhaler x 2 in the past month. She is coughing some- relates to PND- non prod.   Dyspnea:  more limited by knees/was able to vacuum one day prior s symptoms  but had 2 prior episodes of doe with gen ant chest tightness, never at rest, not prevented by taking saba or symbicort prior to ex  Cough: none  Sleeping: bed flat bed  SABA use: only used saba twice in sept 2022 02: none  Covid status:   vax x 3   Rec Ok to try albuterol 15 min before an activity (on alternating days)  that you know would usually make you short of breath  Work on inhaler technique:   Lopressor (metaprolol) 25 mg twice daily in addition to your present blood pressure medication will lower your risk of a heart attack Stay on pepcid after breakfast and supper We will be referring you for cardiology evaluation > neg myoview chemical stress test  / told Dr Stanford Breed it was not related to exertion       09/16/2021  f/u ov/[name] re: asthma f/u cp w/u maint on  symbicort 80 2bid  and overall better but not as good as she gets from pred rx  Chief Complaint  Patient presents with   Follow-up    Breathing is overall doing well. She has some PND and cough- non prod. She rarely uses her albuterol.   Dyspnea:  fine as long as pushing cart including [employer] or stroller / overall improved with less chest tightness  Cough: none  Sleeping: bed flat with two pillows  SABA use: none lately  02: none  Covid status:   vax x 4  Rec Try change symbicort to 160 Take 2 puffs first thing in am and then another 2 puffs about 12 hours later.       08/13/2022 acute  ov/[name] re: asthma  maint on symbicort 160   Chief Complaint  Patient presents with   Acute Visit    Pneumonia at Urgent care 1wk. Ago, sob, cough-pale yellow now, no fever  Dyspnea:  was doing fine until acutely worse Jul 03 2022 rx by [facility] with zpak/ pred/ amox and some better but chest still feels tight , better p saba  Cough: very pale now just in am  Sleeping: no resp cc flat bed 2 pillows  SABA use: 2-3 x per day  02: none Rec Plan A = Automatic = Always=    Symbicort 160 or dulera 200 Take 2 puffs first thing in am and then another 2 puffs about 12 hours later.  Plan B = Backup (to supplement plan A, not to replace it) Only use your albuterol inhaler as a rescue medication  Also  Ok to try albuterol 15 min before an activity (on alternating days)  that you know would usually make you short of breath . Depomedrol 120 mg IM today  For cough > mucinex dm 1200 mg every 12 hours as needed  Try prilosec otc 20mg   Take 30-60 min before first meal of the day and Pepcid ac (famotidine) 20 mg one @  bedtime until cough is completely gone for at least a week without the need for cough suppression GERD diet reviewed, bed blocks rec     08/26/2022  f/u ov/[name] re: asthma   maint on symbicort 160   Chief Complaint  Patient presents with   Follow-up    Pt states no new issues since LOV. Pt states she  feels better since  LOV.  Dyspnea:  limited by knee not breathing  Cough: better though could not tol prilosec > mood change Sleeping: flat bed, 2 pillows no resp  SABA use: none 02: none  Covid status:   1st 3 vax     No obvious day to day or daytime variability or assoc excess/ purulent sputum or mucus plugs or hemoptysis or cp or chest tightness, subjective wheeze or overt sinus or hb symptoms.   sleeping without nocturnal  or early am exacerbation  of respiratory  c/o's or need for noct saba. Also denies any obvious fluctuation of symptoms with weather or environmental changes or other aggravating or alleviating factors except as outlined above   No unusual exposure hx or h/o childhood pna/ asthma or knowledge of premature birth.  Current Allergies, Complete Past Medical History, Past Surgical History, Family History, and Social History were reviewed in [employer] record.  ROS  The following are not active complaints unless bolded Hoarseness, sore throat, dysphagia, dental problems, itching, sneezing,  nasal congestion or discharge of excess mucus or purulent secretions, ear ache,   fever, chills, sweats, unintended wt loss or wt gain, classically pleuritic or exertional cp,  orthopnea pnd or arm/hand swelling  or leg swelling, presyncope, palpitations, abdominal pain, anorexia, nausea, vomiting, diarrhea  or change in bowel habits or change in bladder habits, change in stools or change in urine, dysuria, hematuria,  rash, arthralgias, visual complaints, headache, numbness, weakness or ataxia or problems with walking or coordination,  change in mood or  memory.        Current Meds  Medication Sig   Albuterol Sulfate (PROAIR HFA IN) Inhale 2 puffs into the lungs every 4 (four) hours as needed.   Azelastine-Fluticasone (DYMISTA) 137-50 MCG/ACT SUSP Place 1 puff into the nose 2 (two) times daily.   b complex vitamins tablet Take 1 tablet by mouth daily.    budesonide-formoterol (SYMBICORT) 160-4.5 MCG/ACT inhaler Take 2 puffs first thing in am and then another 2 puffs about 12 hours later.   cholecalciferol (VITAMIN D3) 25 MCG (1000 UNIT) tablet Take 1,000 Units by mouth daily.   Coenzyme Q10 200 MG capsule Take 200 mg by mouth daily.   Docosahexaenoic Acid (DHA) 200 MG CAPS Take by mouth.   famotidine (PEPCID) 20 MG tablet One after bfast and one  after supper   guaiFENesin (MUCINEX) 600 MG 12 hr tablet Take 600 mg by mouth 2 (two) times daily as needed.   ibuprofen (ADVIL) 200 MG tablet Take 200 mg by mouth every 6 (six) hours as needed.   ketotifen (ZADITOR) 0.025 % ophthalmic solution Place 1 drop into the right eye 2 (two) times daily.   levothyroxine (SYNTHROID) 112 MCG tablet Take 112 mcg by mouth daily before breakfast.   liraglutide (VICTOZA) 18 MG/3ML SOPN Inject into the skin.   LORazepam (ATIVAN) 0.5 MG tablet Take 0.5-1 mg by mouth at bedtime.   losartan (COZAAR) 50 MG tablet Take 50 mg by mouth daily.   magnesium oxide (MAG-OX) 400 MG tablet Take 400 mg by mouth daily.   metoprolol tartrate (LOPRESSOR) 25 MG tablet TAKE 1 TABLET BY MOUTH TWICE A DAY   montelukast (SINGULAIR) 10 MG tablet TAKE 1 TABLET BY MOUTH EVERYDAY AT BEDTIME   naproxen sodium (ALEVE) 220 MG tablet Take 220 mg by mouth daily as needed.   Nutritional Supplements (DHEA PO) Take 1 capsule by mouth daily.   ondansetron (ZOFRAN) 4 MG tablet Take 1 tablet (4 mg  total) by mouth every 6 (six) hours.   promethazine-dextromethorphan (PROMETHAZINE-DM) 6.25-15 MG/5ML syrup Take 5 mLs by mouth 4 (four) times daily as needed.   sodium chloride (OCEAN) 0.65 % SOLN nasal spray Place 1 spray into both nostrils as needed for congestion.   spironolactone (ALDACTONE) 25 MG tablet Take 50 mg by mouth daily.   venlafaxine (EFFEXOR) 25 MG tablet Take 25 mg by mouth daily.   vitamin E 180 MG (400 UNITS) capsule Take 400 Units by mouth daily.                      Past Medical  History:  Diagnosis Date   Asthma    Hypertension    Thyroid disease        Objective:   Wts   08/26/2022     219  08/13/2022     222   09/16/2021         224   06/09/2021      225   05/31/20 213 lb (96.6 kg)  01/26/20 217 lb (98.4 kg)  11/16/19 220 lb (99.8 kg)    Vital signs reviewed  08/26/2022  - Note at rest 02 sats  98% on RA   General appearance:    amb pleasant MO (by BMI) wf nad   HEENT : Oropharynx  clear     Nasal turbinates nl    NECK :  without  apparent JVD/ palpable Nodes/TM    LUNGS: no acc muscle use,  Scoliotic contour chest which is clear to A and P bilaterally without cough on insp or exp maneuvers  CV:  RRR  no s3 or murmur or increase in P2, and no edema   ABD: obese  soft and nontender   NEURO:  alert, approp, nl sensorium with  no motor or cerebellar deficits apparent.     CXR PA and Lateral:   08/26/2022 :    I personally reviewed images and impression is as follows:    mod scoliosis no acute changes    Assessment

## 2022-08-26 NOTE — Patient Instructions (Signed)
Please remember to go to the x-ray department   for your tests - we will call you with the results when they are available.     Please schedule a follow up visit in  12 months but call sooner if needed.    

## 2022-08-27 ENCOUNTER — Other Ambulatory Visit: Payer: Self-pay | Admitting: Internal Medicine

## 2022-08-27 NOTE — Assessment & Plan Note (Signed)
Onset around 2010 in pt with allergic rhinitis - allergy testing ? 2010  in [place]  Pos multiple sources including dogs / cat / trees  - 11/16/2019   try symbicort 80 2bid instead of wixella (DPI) > much better - 01/26/2020  After extensive coaching inhaler device,  effectiveness =    90% from baseline 75% - PFT's  05/31/2020  FEV1 1.96 (109 % ) ratio 0.78  p 9 % improvement from saba p symbicort 80 x 2 puffs  prior to study with DLCO  23.79 (135%) corrects to 4.85 (116%)  for alv volume and FV curve min concave   - 09/16/2021  After extensive coaching inhaler device,  effectiveness =    80% > try symbicort 160 2bid x one month to see if any better vs symb 80 > prefers the 160   Back to baseline p recent dx of CAP at UC, no changes needed   F/u q 12 m, sooner prn         Each maintenance medication was reviewed in detail including emphasizing most importantly the difference between maintenance and prns and under what circumstances the prns are to be triggered using an action plan format where appropriate.  Total time for H and P, chart review, counseling, reviewing hfa device(s) and generating customized AVS unique to this office visit / same day charting = 23 min

## 2022-09-01 ENCOUNTER — Ambulatory Visit: Payer: Medicare HMO | Admitting: Acute Care

## 2022-11-22 IMAGING — DX DG CHEST 2V
2 series · 2 of 2 positions shown · non-contrast
Comparison: Radiography of the chest 04/18/2021. Radiography of the
chest dated 01/20/2021.

CLINICAL DATA: Patient complains of shortness of breath.

EXAM:
CHEST - 2 VIEW

[chest pa]
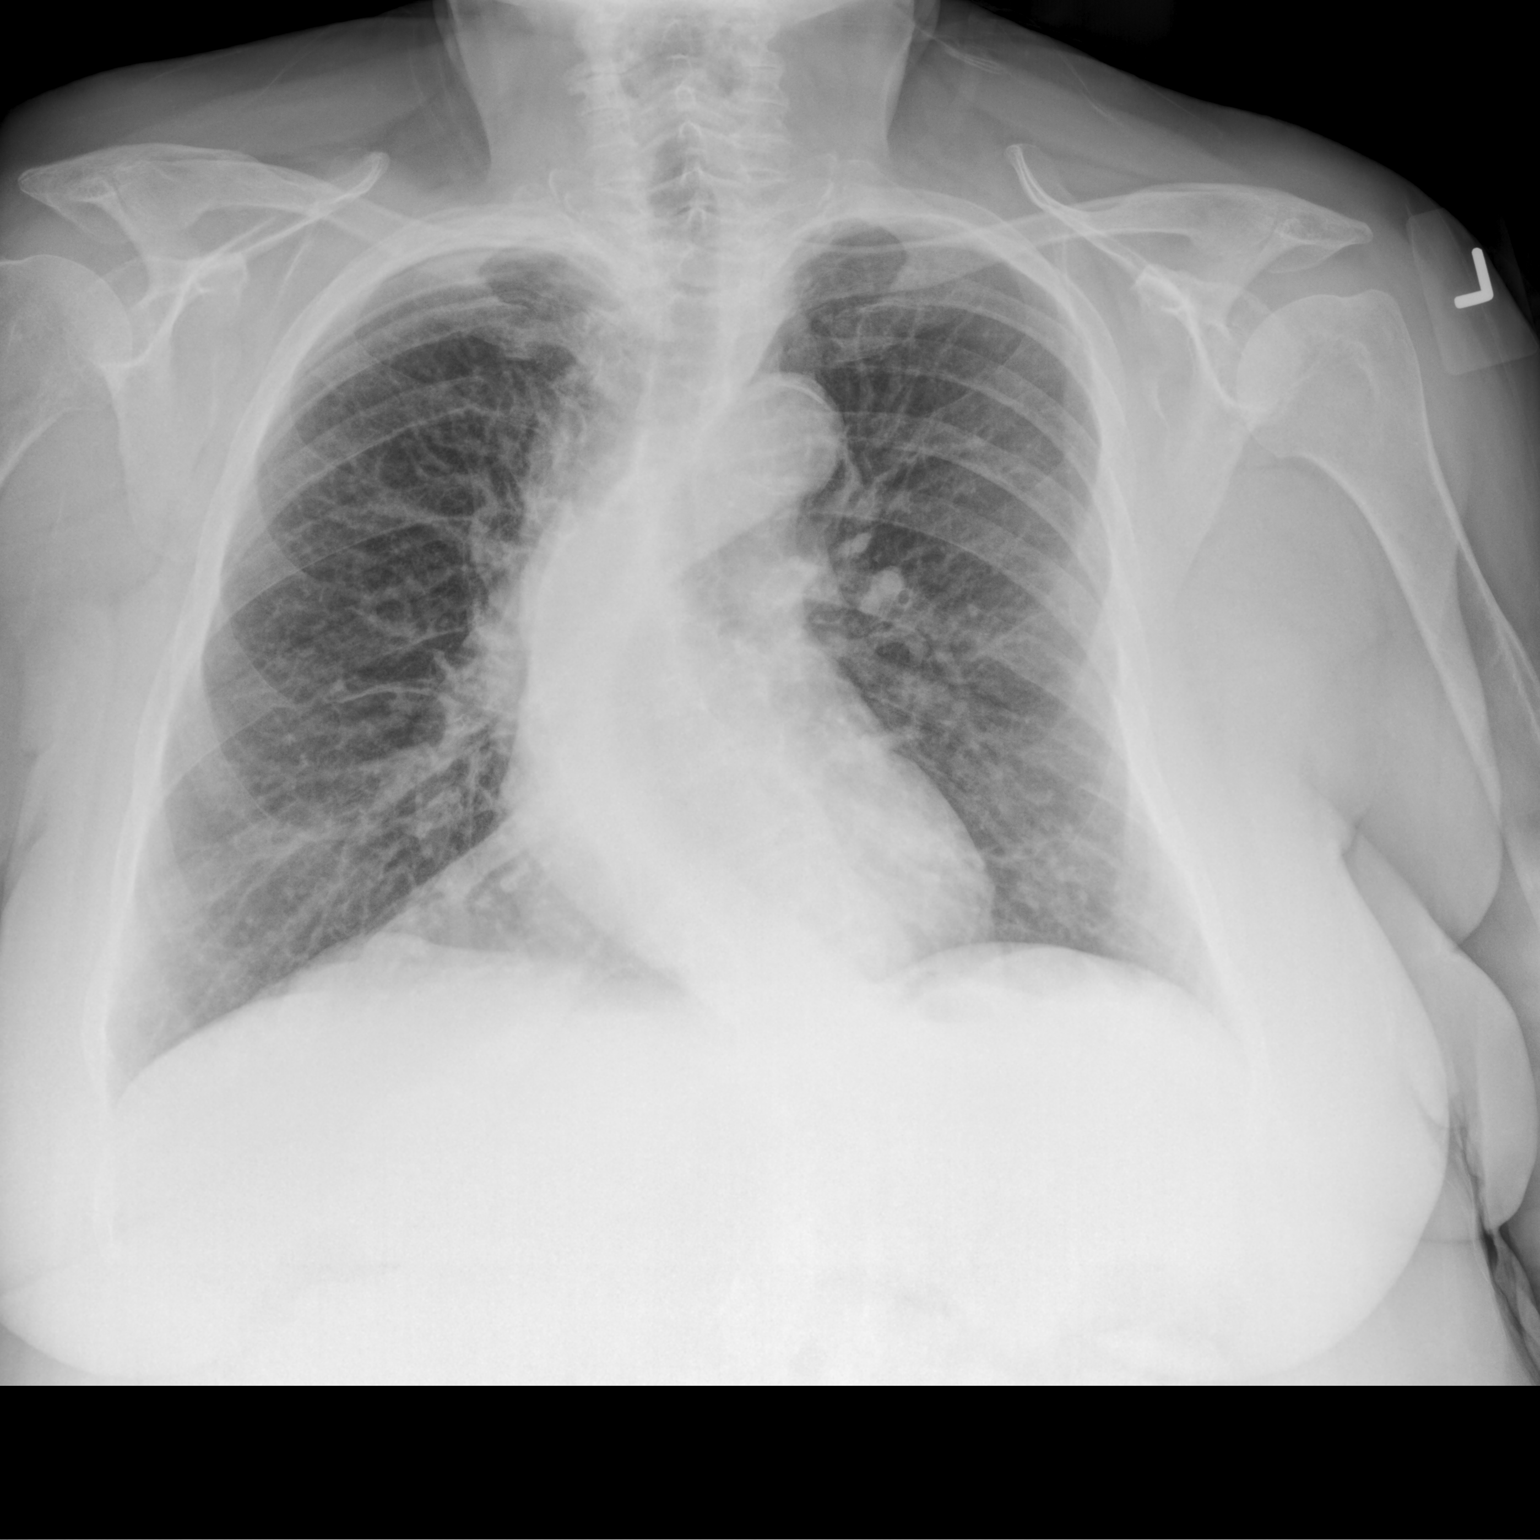

[chest lat]
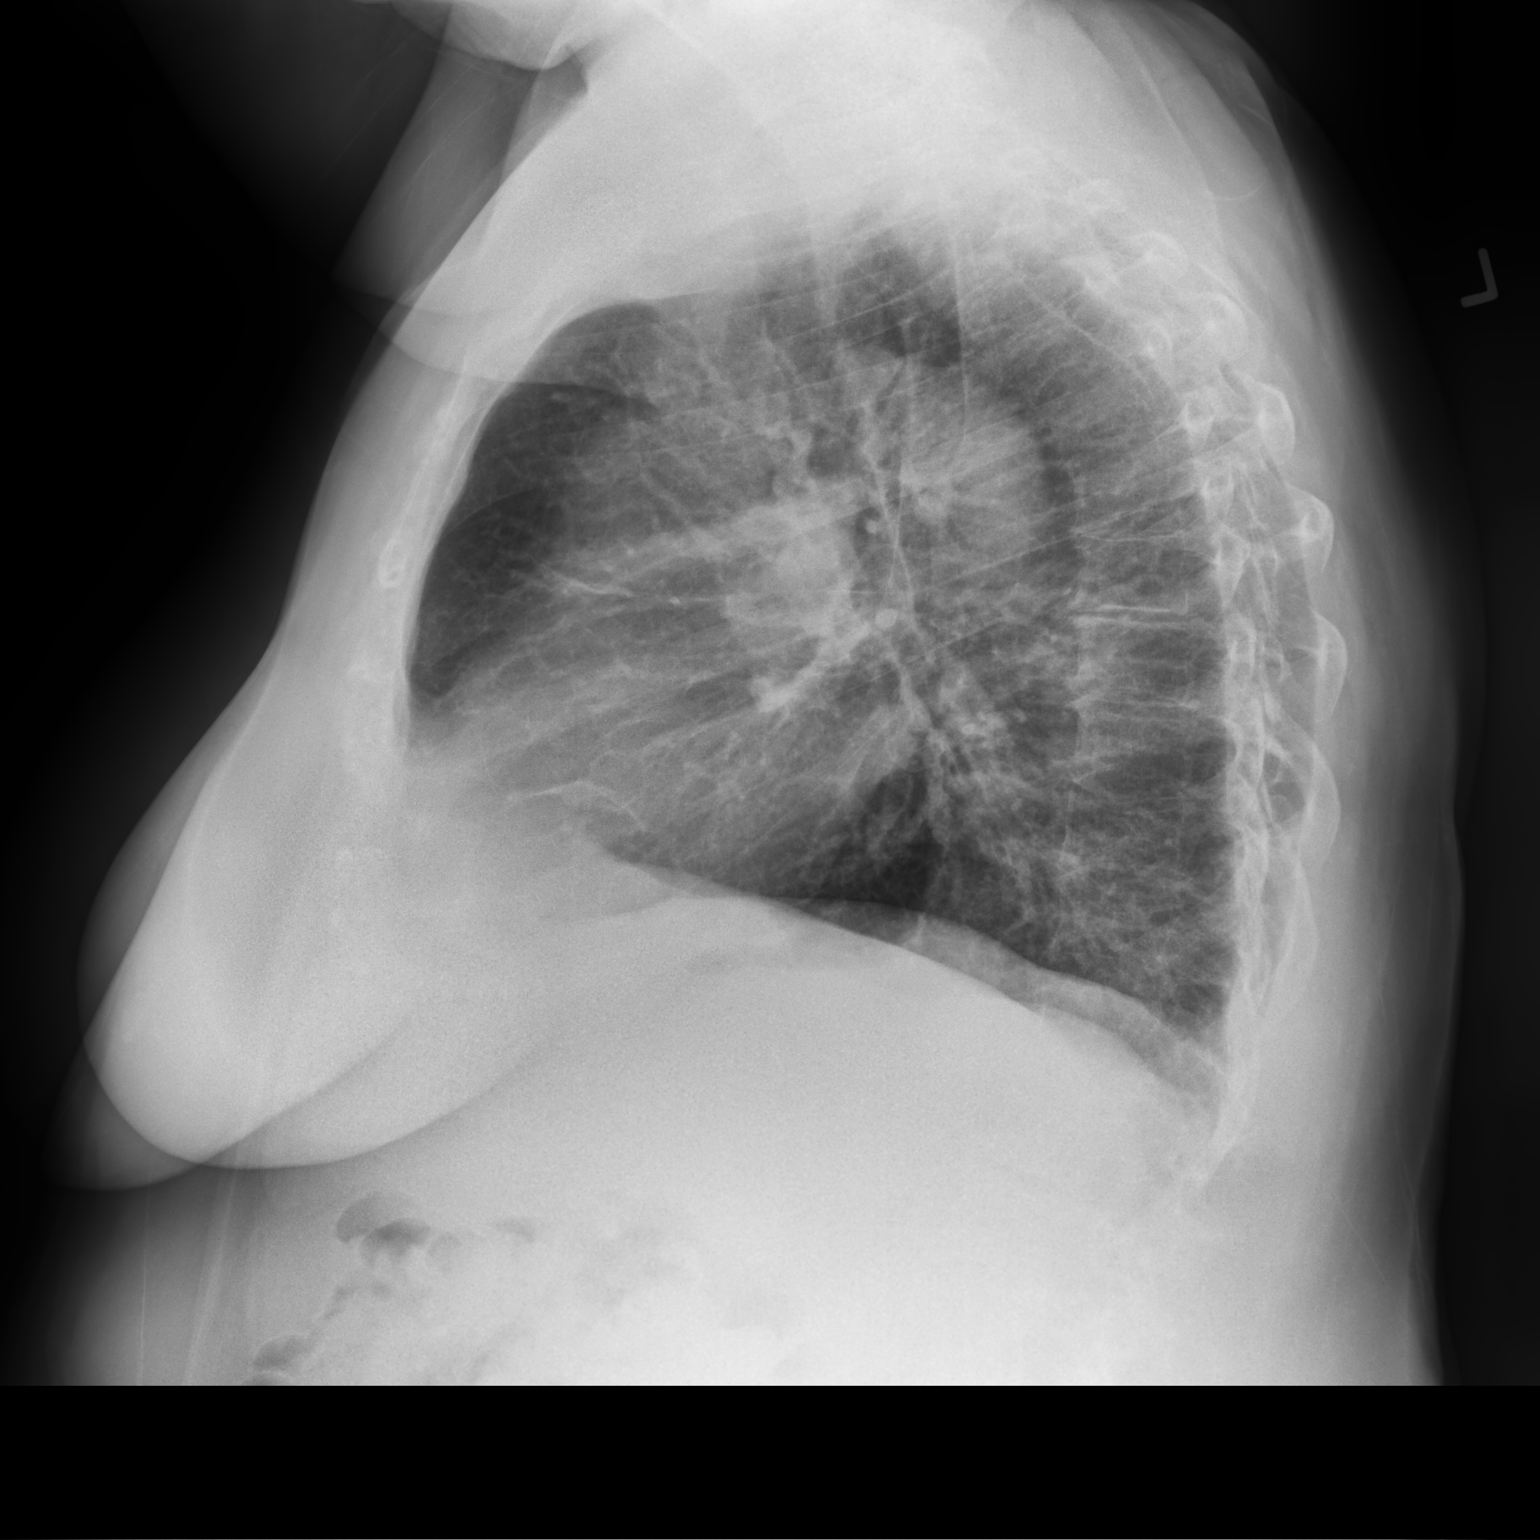

[2 of 2 positions shown; findings below may reference images not displayed]

FINDINGS: Heart size is normal. Aorta is calcified and tortuous. Chronically
coarsened interstitial markings bilaterally. No new airspace
consolidation. No pleural effusion or pneumothorax.
IMPRESSION: No active cardiopulmonary disease.

## 2022-12-04 ENCOUNTER — Telehealth: Payer: Self-pay | Admitting: Primary Care

## 2022-12-04 NOTE — Telephone Encounter (Signed)
Fax received from Dr. Lara Mulch with Sports Medicine to perform a Left Total Knee Replacement on patient.  Patient needs surgery clearance. Surgery is PENDING. Patient was seen on 08/26/2022. Office protocol is a risk assessment can be sent to surgeon if patient has been seen in 60 days or less.   Sending to Derl Barrow for risk assessment or recommendations if patient needs to be seen in office prior to surgical procedure.    Called and got patient scheduled to see Beth on 12/15/2022

## 2022-12-15 ENCOUNTER — Encounter: Payer: Self-pay | Admitting: Primary Care

## 2022-12-15 ENCOUNTER — Ambulatory Visit: Payer: Medicare HMO | Admitting: Primary Care

## 2022-12-15 VITALS — BP 120/64 | HR 85 | Temp 98.6°F | Ht 60.5 in | Wt 226.8 lb

## 2022-12-15 DIAGNOSIS — Z01811 Encounter for preprocedural respiratory examination: Secondary | ICD-10-CM | POA: Diagnosis not present

## 2022-12-15 DIAGNOSIS — J45991 Cough variant asthma: Secondary | ICD-10-CM | POA: Diagnosis not present

## 2022-12-15 MED ORDER — ALBUTEROL SULFATE HFA 108 (90 BASE) MCG/ACT IN AERS: 2.0000 | INHALATION_SPRAY | RESPIRATORY_TRACT | 1 refills | Status: AC | PRN

## 2022-12-15 NOTE — Assessment & Plan Note (Signed)
-   Stable; No acute breathing issues or recent exacerbations. Her asthma symptoms are well controlled on Symbicort 150mcg two puffs q12 hours. Rare SABA use.

## 2022-12-15 NOTE — Telephone Encounter (Signed)
OV notes and clearance form have been faxed back to Sports med and joint replacement . Nothing further needed at this time.

## 2022-12-15 NOTE — Assessment & Plan Note (Signed)
-   Patient is planning for upcoming left total knee replacement with Dr. Lorre Nick, date TBD. Surgery will be under spinal anesthesia. She is an intermediate risk for post-op pulmonary complications d/t age and length of surgery. From pulmonary standpoint she is optimized for surgery, ultimate clearance will be decided by surgeon/anesthesiology.   Major Pulmonary risks identified in the multifactorial risk analysis are but not limited to a) pneumonia; b) recurrent intubation risk; c) prolonged or recurrent acute respiratory failure needing mechanical ventilation; d) prolonged hospitalization; e) DVT/Pulmonary embolism; f) Acute Pulmonary edema  Recommend 1. Short duration of surgery as much as possible and avoid paralytic if possible 2. DVT prophylaxis 3. Aggressive pulmonary toilet with o2, bronchodilatation, and incentive spirometry and early ambulation

## 2022-12-15 NOTE — Patient Instructions (Addendum)
You are low-intermediate risk for post-op pulmonary complications d/t age and length of surgery. From pulmonary standpoint you are optimized for surgery and I think it is safe to go through with knee surgery   Continue to use Symbicort two puffs morning and evening (use day of surgery)  Look at getting an incentive spirometer on [employer] to use after surgery to encourage deep breathing which will help minimize risk for post-op pneumonia or respiratory failure   If you develop acute respiratory symptoms notify office before surgery   Rx: Refilled Albuterol   Follow-up 6 months with Dr. Melvyn Novas or sooner if needed

## 2022-12-15 NOTE — Progress Notes (Signed)
@Patient  ID: Jeanne Ortiz, female    DOB: [birth year], 82 y.o.   MRN: [id number]  Chief Complaint  Patient presents with   Medical Clearance    Surgical clearance.  L total knee replacement.  Hx asthma.  Doing well    Referring provider: Joya Gaskins, *  HPI: 82 year old female, never smoked. PMH significant for cough variant asthma, chronic rhinitis.   12/15/2022 Patient presents today for surgical risk assessment for total left knee replacement with Dr. Augustin Coupe, date has not yet been set. Planning spinal anesthesia. She follows with Dr. Melvyn Novas for asthma, last seen in December 2023. She has been doing very well breathing wise the last couple of months. She has no acute respiratory symptoms. She takes Symbicort 114mcg two puffs morning and evening as directed. She rarely requires Albuterol rescue inhaler. Denies fevers, chills, chest discomfort, shortness of breath, cough or wheezing.    Allergies  Allergen Reactions   Fentanyl And Related Nausea And Vomiting    Severe nausea and vomiting and severe migraine headache.   Morphine And Related    Other     brazile and macadamia nuts ANY NARCOTIC    Propofol    Sulfa Antibiotics     Immunization History  Administered Date(s) Administered   Influenza, High Dose Seasonal PF 06/01/2017, 07/11/2018, 07/11/2018, 05/09/2019, 07/10/2020, 06/26/2021   Influenza, Seasonal, Injecte, Preservative Fre 07/30/2014   Influenza-Unspecified 06/26/2009, 06/26/2009, 06/12/2010, 06/12/2010, 05/29/2013, 05/29/2013, 07/30/2014, 07/30/2014, 06/01/2017, 07/11/2018, 07/11/2018, 05/09/2019, 05/09/2019   PFIZER Comirnaty(Gray Top)Covid-19 Tri-Sucrose Vaccine 11/20/2019, 12/11/2019, 07/10/2020   PFIZER(Purple Top)SARS-COV-2 Vaccination 11/20/2019, 12/11/2019, 07/10/2020   Pfizer Covid-19 Vaccine Bivalent Booster 75yrs & up 06/26/2021   Pneumococcal Conjugate-13 02/22/2014, 02/22/2014   Pneumococcal Polysaccharide-23 09/15/2003, 09/15/2003,  02/16/2017, 02/16/2017   Td 04/06/2013   Tdap 03/23/2017, 03/23/2017, 01/14/2019   Tetanus 04/06/2013   Zoster Recombinat (Shingrix) 11/15/2018, 11/15/2018, 04/02/2019, 04/02/2019   Zoster, Live 03/28/2014, 03/28/2014, 11/15/2018, 04/02/2019    Past Medical History:  Diagnosis Date   Asthma    Hypertension    PUD (peptic ulcer disease)    Thyroid disease     Tobacco History: Social History   Tobacco Use  Smoking Status Never  Smokeless Tobacco Never   Counseling given: Not Answered   Outpatient Medications Prior to Visit  Medication Sig Dispense Refill   Azelastine-Fluticasone (DYMISTA) 137-50 MCG/ACT SUSP Place 1 puff into the nose 2 (two) times daily. 23 g 11   b complex vitamins tablet Take 1 tablet by mouth daily.     budesonide-formoterol (SYMBICORT) 160-4.5 MCG/ACT inhaler TAKE 2 PUFFS FIRST THING IN AM AND THEN ANOTHER 2 PUFFS ABOUT 12 HOURS LATER. 10.2 each 11   cholecalciferol (VITAMIN D3) 25 MCG (1000 UNIT) tablet Take 1,000 Units by mouth daily.     Coenzyme Q10 200 MG capsule Take 200 mg by mouth daily.     Docosahexaenoic Acid (DHA) 200 MG CAPS Take by mouth.     famotidine (PEPCID) 20 MG tablet One after bfast and one  after supper 60 tablet 5   guaiFENesin (MUCINEX) 600 MG 12 hr tablet Take 600 mg by mouth 2 (two) times daily as needed.     ibuprofen (ADVIL) 200 MG tablet Take 200 mg by mouth every 6 (six) hours as needed.     levothyroxine (SYNTHROID) 112 MCG tablet Take 112 mcg by mouth daily before breakfast.     LORazepam (ATIVAN) 0.5 MG tablet Take 0.5-1 mg by mouth at bedtime.  losartan (COZAAR) 50 MG tablet Take 50 mg by mouth daily.     magnesium oxide (MAG-OX) 400 MG tablet Take 400 mg by mouth daily.     metoprolol tartrate (LOPRESSOR) 25 MG tablet TAKE 1 TABLET BY MOUTH TWICE A DAY (Patient taking differently: Take 25 mg by mouth 2 (two) times daily as needed (if extra high blood pressure).) 180 tablet 1   montelukast (SINGULAIR) 10 MG tablet  TAKE 1 TABLET BY MOUTH EVERYDAY AT BEDTIME 30 tablet 0   Multiple Vitamins-Minerals (ZINC PO) Take 1 tablet by mouth daily as needed (during allergy season).     naproxen sodium (ALEVE) 220 MG tablet Take 220 mg by mouth daily as needed.     Nutritional Supplements (DHEA PO) Take 1 capsule by mouth daily.     Olopatadine HCl 0.2 % SOLN Apply 1 drop to eye daily as needed (for itchy eyes).     ondansetron (ZOFRAN) 4 MG tablet Take 1 tablet (4 mg total) by mouth every 6 (six) hours. 12 tablet 0   sodium chloride (OCEAN) 0.65 % SOLN nasal spray Place 1 spray into both nostrils as needed for congestion.     spironolactone (ALDACTONE) 25 MG tablet Take 50 mg by mouth daily.     tirzepatide [facility]) 2.5 MG/0.5ML Pen Inject 2.5 mg into the skin once a week.     venlafaxine (EFFEXOR) 25 MG tablet Take 25 mg by mouth daily.     vitamin E 180 MG (400 UNITS) capsule Take 400 Units by mouth daily.     Albuterol Sulfate (PROAIR HFA IN) Inhale 2 puffs into the lungs every 4 (four) hours as needed.     ketotifen (ZADITOR) 0.025 % ophthalmic solution Place 1 drop into the right eye 2 (two) times daily. (Patient not taking: Reported on 12/15/2022)     liraglutide (VICTOZA) 18 MG/3ML SOPN Inject into the skin. (Patient not taking: Reported on 12/15/2022)     promethazine-dextromethorphan (PROMETHAZINE-DM) 6.25-15 MG/5ML syrup Take 5 mLs by mouth 4 (four) times daily as needed. (Patient not taking: Reported on 12/15/2022)     No facility-administered medications prior to visit.   Review of Systems  Review of Systems  Constitutional: Negative.   HENT: Negative.    Respiratory: Negative.  Negative for cough, shortness of breath and wheezing.   Cardiovascular: Negative.    Physical Exam  BP 120/64 (BP Location: Left Arm, Patient Position: Sitting, Cuff Size: Large)   Pulse 85   Temp 98.6 F (37 C) (Oral)   Ht 5' 0.5" (1.537 m)   Wt 226 lb 12.8 oz (102.9 kg)   SpO2 97%   BMI 43.56 kg/m  Physical  Exam Constitutional:      General: She is not in acute distress.    Appearance: Normal appearance. She is not ill-appearing.  HENT:     Head: Normocephalic and atraumatic.     Mouth/Throat:     Mouth: Mucous membranes are moist.     Pharynx: Oropharynx is clear.  Cardiovascular:     Rate and Rhythm: Normal rate and regular rhythm.  Pulmonary:     Effort: Pulmonary effort is normal.     Breath sounds: Normal breath sounds. No wheezing or rales.     Comments: CTA Musculoskeletal:        General: Normal range of motion.  Skin:    General: Skin is warm and dry.  Neurological:     General: No focal deficit present.     Mental Status:  She is alert and oriented to person, place, and time. Mental status is at baseline.  Psychiatric:        Mood and Affect: Mood normal.        Behavior: Behavior normal.        Thought Content: Thought content normal.        Judgment: Judgment normal.      Lab Results:  CBC    Component Value Date/Time   WBC 6.9 05/14/2022 0931   RBC 4.38 05/14/2022 0931   HGB 13.2 05/14/2022 0931   HCT 39.2 05/14/2022 0931   PLT 164 05/14/2022 0931   MCV 89.5 05/14/2022 0931   MCH 30.1 05/14/2022 0931   MCHC 33.7 05/14/2022 0931   RDW 12.4 05/14/2022 0931   LYMPHSABS 2.0 05/14/2022 0931   MONOABS 0.4 05/14/2022 0931   EOSABS 0.3 05/14/2022 0931   BASOSABS 0.0 05/14/2022 0931    BMET    Component Value Date/Time   NA 138 05/14/2022 0931   K 3.9 05/14/2022 0931   CL 104 05/14/2022 0931   CO2 22 05/14/2022 0931   GLUCOSE 142 (H) 05/14/2022 0931   BUN 23 05/14/2022 0931   CREATININE 0.67 05/14/2022 0931   CALCIUM 9.6 05/14/2022 0931   GFRNONAA >60 05/14/2022 0931    BNP No results found for: "BNP"  ProBNP No results found for: "PROBNP"  Imaging: No results found.   Assessment & Plan:   Cough variant asthma - Stable; No acute breathing issues or recent exacerbations. Her asthma symptoms are well controlled on Symbicort 145mcg two puffs  q12 hours. Rare SABA use.   Pre-operative respiratory examination - Patient is planning for upcoming left total knee replacement with Dr. Lorre Nick, date TBD. Surgery will be under spinal anesthesia. She is an intermediate risk for post-op pulmonary complications d/t age and length of surgery. From pulmonary standpoint she is optimized for surgery, ultimate clearance will be decided by surgeon/anesthesiology.   Major Pulmonary risks identified in the multifactorial risk analysis are but not limited to a) pneumonia; b) recurrent intubation risk; c) prolonged or recurrent acute respiratory failure needing mechanical ventilation; d) prolonged hospitalization; e) DVT/Pulmonary embolism; f) Acute Pulmonary edema  Recommend 1. Short duration of surgery as much as possible and avoid paralytic if possible 2. DVT prophylaxis 3. Aggressive pulmonary toilet with o2, bronchodilatation, and incentive spirometry and early ambulation      1) RISK FOR PROLONGED MECHANICAL VENTILAION - > 48h  1A) Arozullah - Prolonged mech ventilation risk Arozullah Postperative Pulmonary Risk Score - for mech ventilation dependence >48h [employer], Ann Surg 2000, major non-cardiac surgery) Comment Score  Type of surgery - abd ao aneurysm (27), thoracic (21), neurosurgery / upper abdominal / vascular (21), neck (11) Left total knee  6  Emergency Surgery - (11)  0  ALbumin < 3 or poor nutritional state - (9)  0  BUN > 30 -  (8)  0  Partial or completely dependent functional status - (7)  0  COPD -  (6)  0  Age - 60 to 69 (4), > 70  (6)  6  TOTAL  12  Risk Stratifcation scores  - < 10 (0.5%), 11-19 (1.8%), 20-27 (4.2%), 28-40 (10.1%), >40 (26.6%)  1.8% risk prolonged mechanical ventilation      1B) GUPTA - Prolonged Mech Vent Risk Score source Risk  Guptal post op prolonged mech ventilation > 48h or reintubation < 30 days - ACS 2007-2008 dataset - http://lewis-perez.info/  0.3 % Risk of  mechanical ventilation for >48 hrs after surgery, or unplanned intubation ?30 days of surgery    2) RISK FOR POST OP PNEUMONIA Score source Risk  Lyndel Safe - Post Op Pnemounia risk  [email or web address] 0.4 % Risk of postoperative pneumonia    R3) ISK FOR ANY POST-OP PULMONARY COMPLICATION Score source Risk  CANET/ARISCAT Score - risk for ANY/ALl pulmonary complications - > risk of in-hospital post-op pulmonary complications (composite including respiratory failure, respiratory infection, pleural effusion, atelectasis, pneumothorax, bronchospasm, aspiration pneumonitis) [email or web address] - based on age, anemia, pulse ox, resp infection prior 30d, incision site, duration of surgery, and emergency v elective surgery Intermediate risk 13.3% risk of in-hospital post-op pulmonary complications (composite including respiratory failure, respiratory infection, pleural effusion, atelectasis, pneumothorax, bronchospasm, aspiration pneumonitis)    Martyn Ehrich, NP 12/15/2022

## 2023-02-02 NOTE — Progress Notes (Signed)
Surgery orders requested via Epic inbox. °

## 2023-02-05 ENCOUNTER — Other Ambulatory Visit: Payer: Self-pay | Admitting: Orthopedic Surgery

## 2023-02-05 DIAGNOSIS — G8929 Other chronic pain: Secondary | ICD-10-CM

## 2023-02-08 NOTE — Progress Notes (Signed)
COVID Vaccine received:  []  No [x]  Yes Date of any COVID positive Test in last 90 days:  PCP - [name], FNP  at [facility]  [phone] (Work)  [phone] (Fax)  Cardiologist - none Pulmonology- Dr. Sandrea Hughs, [place], NP  Clearance 12-15-2022  note  Chest x-ray - 08-17-2022  2v  Epic EKG -  05-19-2022  Epic Stress Test -  ECHO -  Cardiac Cath -   PCR screen: [x]  Ordered & Completed           []   No Order but Needs PROFEND           []   N/A for this surgery  Surgery Plan:  []  Ambulatory                            [x]  Outpatient in bed                            []  Admit  Anesthesia:    []  General  [x]  Spinal                           []   Choice []   MAC  Pacemaker / ICD device [x]  No []  Yes   Spinal Cord Stimulator:[x]  No []  Yes       History of Sleep Apnea? [x]  No []  Yes   CPAP used?- [x]  No []  Yes    Does the patient monitor blood sugar?          []  No []  Yes  [x]  N/A  Patient has: [x]  NO Hx DM   []  Pre-DM                 []  DM1  []   DM2 Does patient have a [employer] or Dexacom? []  No []  Yes   Fasting Blood Sugar Ranges-  Checks Blood Sugar _____ times a day  Blood Thinner / Instructions: none Aspirin Instructions: none  ERAS Protocol Ordered: []  No  [x]  Yes PRE-SURGERY [x]  ENSURE  []  G2  Patient is to be NPO after:   Comments: Patient was given the 5 CHG shower / bath instructions for THA / TKA / Total or Reverse Shoulder arthroplasty surgery along with 2 bottles of the CHG soap. Patient will start this on: Thursday  Feb 11, 2023  All questions were asked and answered, Patient voiced understanding of this process.   Activity level: Patient is able / unable to climb a flight of stairs without difficulty; []  No CP  []  No SOB, but would have ___   Patient can / can not perform ADLs without assistance.   Anesthesia review: HTN, Asthma  Patient denies shortness of breath, fever, cough and chest pain at PAT appointment.  Patient verbalized  understanding and agreement to the Pre-Surgical Instructions that were given to them at this PAT appointment. Patient was also educated of the need to review these PAT instructions again prior to her surgery.I reviewed the appropriate phone numbers to call if they have any and questions or concerns.

## 2023-02-08 NOTE — Patient Instructions (Signed)
SURGICAL WAITING ROOM VISITATION Patients having surgery or a procedure may have no more than 2 support people in the waiting area - these visitors may rotate in the visitor waiting room.   Due to an increase in RSV and influenza rates and associated hospitalizations, children ages 3 and under may not visit patients in [facility] hospitals. If the patient needs to stay at the hospital during part of their recovery, the visitor guidelines for inpatient rooms apply.  PRE-OP VISITATION  Pre-op nurse will coordinate an appropriate time for 1 support person to accompany the patient in pre-op.  This support person may not rotate.  This visitor will be contacted when the time is appropriate for the visitor to come back in the pre-op area.  Please refer to the [facility] website for the visitor guidelines for Inpatients (after your surgery is over and you are in a regular room).  You are not required to quarantine at this time prior to your surgery. However, you must do this: Hand Hygiene often Do NOT share personal items Notify your provider if you are in close contact with someone who has COVID or you develop fever 100.4 or greater, new onset of sneezing, cough, sore throat, shortness of breath or body aches.  If you test positive for Covid or have been in contact with anyone that has tested positive in the last 10 days please notify you surgeon.    Your procedure is scheduled on:  Monday  February 15, 2023  Report to [facility] Main Entrance: [facility]sen entrance where the [employer] is available.   Report to admitting at: 06:45    AM  +++++Call this number if you have any questions or problems the morning of surgery [phone]  Do not eat food after Midnight the night prior to your surgery/procedure.  After Midnight you may have the following liquids until   06:15  AM  DAY OF SURGERY  Clear Liquid Diet Water Black Coffee (sugar ok, NO MILK/CREAM OR CREAMERS)  Tea (sugar ok, NO  MILK/CREAM OR CREAMERS) regular and decaf                             Plain Jell-O  with no fruit (NO RED)                                           Fruit ices (not with fruit pulp, NO RED)                                     Popsicles (NO RED)                                                                  Juice: apple, WHITE grape, WHITE cranberry Sports drinks like Gatorade or Powerade (NO RED)                    The day of surgery:  Drink ONE (1) Pre-Surgery Clear Ensure at   06:15 AM the morning of surgery.  Drink in one sitting. Do not sip.  This drink was given to you during your hospital pre-op appointment visit. Nothing else to drink after completing the Pre-Surgery Clear Ensure : No candy, chewing gum or throat lozenges.    FOLLOW ANY ADDITIONAL PRE OP INSTRUCTIONS YOU RECEIVED FROM YOUR SURGEON'S OFFICE!!!   Oral Hygiene is also important to reduce your risk of infection.        Remember - BRUSH YOUR TEETH THE MORNING OF SURGERY WITH YOUR REGULAR TOOTHPASTE  Do NOT smoke after Midnight the night before surgery.  Take ONLY these medicines the morning of surgery with A SIP OF WATER: levothyroxine, metoprolol.  You may use your Symbicort and Albuterol inhalers if needed.                    You may not have any metal on your body including hair pins, jewelry, and body piercing  Do not wear make-up, lotions, powders, perfumes or deodorant  Do not wear nail polish including gel and S&S, artificial / acrylic nails, or any other type of covering on natural nails including finger and toenails. If you have artificial nails, gel coating, etc., that needs to be removed by a nail salon, Please have this removed prior to surgery. Not doing so may mean that your surgery could be cancelled or delayed if the Surgeon or anesthesia staff feels like they are unable to monitor you safely.   Do not shave 48 hours prior to surgery to avoid nicks in your skin which may contribute to  postoperative infections.   Contacts, Hearing Aids, dentures or bridgework may not be worn into surgery. DENTURES WILL BE REMOVED PRIOR TO SURGERY PLEASE DO NOT APPLY "Poly grip" OR ADHESIVES!!!  You may bring a small overnight bag with you on the day of surgery, only pack items that are not valuable. [facility] IS NOT RESPONSIBLE   FOR VALUABLES THAT ARE LOST OR STOLEN.   Do not bring your home medications to the hospital EXCEPT YOUR ALBUTEROL INHALER. The Pharmacy will dispense medications listed on your medication list to you during your admission in the Hospital.  Special Instructions: Bring a copy of your healthcare power of attorney and living will documents the day of surgery, if you wish to have them scanned into your [facility] Records- EPIC  Please read over the following fact sheets you were given: IF YOU HAVE QUESTIONS ABOUT YOUR PRE-OP INSTRUCTIONS, PLEASE CALL [phone].       Pre-operative 5 CHG Bath Instructions   You can play a key role in reducing the risk of infection after surgery. Your skin needs to be as free of germs as possible. You can reduce the number of germs on your skin by washing with CHG (chlorhexidine gluconate) soap before surgery. CHG is an antiseptic soap that kills germs and continues to kill germs even after washing.   DO NOT use if you have an allergy to chlorhexidine/CHG or antibacterial soaps. If your skin becomes reddened or irritated, stop using the CHG and notify one of our RNs at [phone]  Please shower with the CHG soap starting 4 days before surgery using the following schedule: START SHOWERS ON THURSDAY  Feb 11, 2023  Please keep in mind the following:  DO NOT shave, including legs and underarms, starting the day of your first shower.   You may  shave your face at any point before/day of surgery.   Place clean sheets on your bed the day you start using CHG soap. Use a clean washcloth (not used since being washed) for each shower. DO NOT sleep with pets once you start using the CHG.   CHG Shower Instructions:  If you choose to wash your hair and private area, wash first with your normal shampoo/soap.  After you use shampoo/soap, rinse your hair and body thoroughly to remove shampoo/soap residue.  Turn the water OFF and apply about 3 tablespoons (45 ml) of CHG soap to a CLEAN washcloth.  Apply CHG soap ONLY FROM YOUR NECK DOWN TO YOUR TOES (washing for 3-5 minutes)  DO NOT use CHG soap on face, private areas, open wounds, or sores.  Pay special attention to the area where your surgery is being performed.  If you are having back surgery, having someone wash your back for you may be helpful.  Wait 2 minutes after CHG soap is applied, then you may rinse off the CHG soap.  Pat dry with a clean towel  Put on clean clothes/pajamas   If you choose to wear lotion, please use ONLY the CHG-compatible lotions on the back of this paper.     Additional instructions for the day of surgery: DO NOT APPLY any lotions, deodorants, cologne, or perfumes.   Put on clean/comfortable clothes.  Brush your teeth.  Ask your nurse before applying any prescription medications to the skin.      CHG Compatible Lotions   Aveeno Moisturizing lotion  Cetaphil Moisturizing Cream  Cetaphil Moisturizing Lotion  Clairol Herbal Essence Moisturizing Lotion, Dry Skin  Clairol Herbal Essence Moisturizing Lotion, Extra Dry Skin  Clairol Herbal Essence Moisturizing Lotion, Normal Skin  Curel Age Defying Therapeutic Moisturizing Lotion with Alpha Hydroxy  Curel Extreme Care Body Lotion  Curel Soothing Hands Moisturizing Hand Lotion  Curel Therapeutic Moisturizing Cream, Fragrance-Free  Curel Therapeutic Moisturizing Lotion, Fragrance-Free  Curel Therapeutic  Moisturizing Lotion, Original Formula  Eucerin Daily Replenishing Lotion  Eucerin Dry Skin Therapy Plus Alpha Hydroxy Crme  Eucerin Dry Skin Therapy Plus Alpha Hydroxy Lotion  Eucerin Original Crme  Eucerin Original Lotion  Eucerin Plus Crme Eucerin Plus Lotion  Eucerin TriLipid Replenishing Lotion  Keri Anti-Bacterial Hand Lotion  Keri Deep Conditioning Original Lotion Dry Skin Formula Softly Scented  Keri Deep Conditioning Original Lotion, Fragrance Free Sensitive Skin Formula  Keri Lotion Fast Absorbing Fragrance Free Sensitive Skin Formula  Keri Lotion Fast Absorbing Softly Scented Dry Skin Formula  Keri Original Lotion  Keri Skin Renewal Lotion Keri Silky Smooth Lotion  Keri Silky Smooth Sensitive Skin Lotion  Nivea Body Creamy Conditioning Oil  Nivea Body Extra Enriched Lotion  Nivea Body Original Lotion  Nivea Body Sheer Moisturizing Lotion Nivea Crme  Nivea Skin Firming Lotion  NutraDerm 30 Skin Lotion  NutraDerm Skin Lotion  NutraDerm Therapeutic Skin Cream  NutraDerm Therapeutic Skin Lotion  ProShield Protective Hand Cream  Provon moisturizing lotion ON THE DAY OF SURGERY : Do not apply any lotions/deodorants the morning of surgery.  Please wear clean clothes to the hospital/surgery center.    FAILURE TO FOLLOW THESE INSTRUCTIONS MAY RESULT IN THE CANCELLATION OF YOUR SURGERY  PATIENT SIGNATURE_________________________________  NURSE SIGNATURE__________________________________  ________________________________________________________________________       Jeanne Ortiz    An incentive spirometer is  a tool that can help keep your lungs clear and active. This tool measures how well you are filling your lungs with each breath. Taking long deep breaths may help reverse or decrease the chance of developing breathing (pulmonary) problems (especially infection) following: A long period of time when you are unable to move or be active. BEFORE THE  PROCEDURE  If the spirometer includes an indicator to show your best effort, your nurse or respiratory therapist will set it to a desired goal. If possible, sit up straight or lean slightly forward. Try not to slouch. Hold the incentive spirometer in an upright position. INSTRUCTIONS FOR USE  Sit on the edge of your bed if possible, or sit up as far as you can in bed or on a chair. Hold the incentive spirometer in an upright position. Breathe out normally. Place the mouthpiece in your mouth and seal your lips tightly around it. Breathe in slowly and as deeply as possible, raising the piston or the ball toward the top of the column. Hold your breath for 3-5 seconds or for as long as possible. Allow the piston or ball to fall to the bottom of the column. Remove the mouthpiece from your mouth and breathe out normally. Rest for a few seconds and repeat Steps 1 through 7 at least 10 times every 1-2 hours when you are awake. Take your time and take a few normal breaths between deep breaths. The spirometer may include an indicator to show your best effort. Use the indicator as a goal to work toward during each repetition. After each set of 10 deep breaths, practice coughing to be sure your lungs are clear. If you have an incision (the cut made at the time of surgery), support your incision when coughing by placing a pillow or rolled up towels firmly against it. Once you are able to get out of bed, walk around indoors and cough well. You may stop using the incentive spirometer when instructed by your caregiver.  RISKS AND COMPLICATIONS Take your time so you do not get dizzy or light-headed. If you are in pain, you may need to take or ask for pain medication before doing incentive spirometry. It is harder to take a deep breath if you are having pain. AFTER USE Rest and breathe slowly and easily. It can be helpful to keep track of a log of your progress. Your caregiver can provide you with a simple table  to help with this. If you are using the spirometer at home, follow these instructions: SEEK MEDICAL CARE IF:  You are having difficultly using the spirometer. You have trouble using the spirometer as often as instructed. Your pain medication is not giving enough relief while using the spirometer. You develop fever of 100.5 F (38.1 C) or higher.                                                                                                    SEEK IMMEDIATE MEDICAL CARE IF:  You cough up bloody sputum that had not been present before. You develop fever of 102 F (  38.9 C) or greater. You develop worsening pain at or near the incision site. MAKE SURE YOU:  Understand these instructions. Will watch your condition. Will get help right away if you are not doing well or get worse. Document Released: 01/11/2007 Document Revised: 11/23/2011 Document Reviewed: 03/14/2007 [facility] Patient Information 2014 [place], [place].

## 2023-02-10 ENCOUNTER — Encounter (HOSPITAL_COMMUNITY): Payer: Self-pay | Admitting: *Deleted

## 2023-02-10 ENCOUNTER — Other Ambulatory Visit: Payer: Self-pay

## 2023-02-10 ENCOUNTER — Encounter (HOSPITAL_COMMUNITY)
Admission: RE | Admit: 2023-02-10 | Discharge: 2023-02-10 | Disposition: A | Discharge status: Home / Self Care | Payer: Medicare HMO | Source: Ambulatory Visit | Attending: Orthopedic Surgery | Admitting: Orthopedic Surgery

## 2023-02-10 VITALS — BP 127/69 | HR 78 | Temp 98.1°F | Resp 20 | Ht 60.05 in | Wt 222.0 lb

## 2023-02-10 DIAGNOSIS — Z01818 Encounter for other preprocedural examination: Secondary | ICD-10-CM

## 2023-02-10 DIAGNOSIS — M25562 Pain in left knee: Secondary | ICD-10-CM | POA: Diagnosis not present

## 2023-02-10 DIAGNOSIS — G8929 Other chronic pain: Secondary | ICD-10-CM | POA: Diagnosis not present

## 2023-02-10 DIAGNOSIS — Z01812 Encounter for preprocedural laboratory examination: Secondary | ICD-10-CM | POA: Insufficient documentation

## 2023-02-10 HISTORY — DX: Headache, unspecified: R51.9

## 2023-02-10 HISTORY — DX: Hypothyroidism, unspecified: E03.9

## 2023-02-10 HISTORY — DX: Anemia, unspecified: D64.9

## 2023-02-10 HISTORY — DX: Pneumonia, unspecified organism: J18.9

## 2023-02-10 HISTORY — DX: Gastro-esophageal reflux disease without esophagitis: K21.9

## 2023-02-10 HISTORY — DX: Anxiety disorder, unspecified: F41.9

## 2023-02-10 HISTORY — DX: Chronic kidney disease, unspecified: N18.9

## 2023-02-10 HISTORY — DX: Depression, unspecified: F32.A

## 2023-02-10 HISTORY — DX: Unspecified osteoarthritis, unspecified site: M19.90

## 2023-02-10 LAB — CBC WITH DIFFERENTIAL/PLATELET
Abs Immature Granulocytes: 0.02 10*3/uL (ref 0.00–0.07)
Basophils Absolute: 0 10*3/uL (ref 0.0–0.1)
Basophils Relative: 1 %
Eosinophils Absolute: 0.3 10*3/uL (ref 0.0–0.5)
Eosinophils Relative: 5 %
HCT: 39.5 % (ref 36.0–46.0)
Hemoglobin: 13 g/dL (ref 12.0–15.0)
Immature Granulocytes: 0 %
Lymphocytes Relative: 22 %
Lymphs Abs: 1.5 10*3/uL (ref 0.7–4.0)
MCH: 29.6 pg (ref 26.0–34.0)
MCHC: 32.9 g/dL (ref 30.0–36.0)
MCV: 90 fL (ref 80.0–100.0)
Monocytes Absolute: 0.5 10*3/uL (ref 0.1–1.0)
Monocytes Relative: 7 %
Neutro Abs: 4.3 10*3/uL (ref 1.7–7.7)
Neutrophils Relative %: 65 %
Platelets: 197 10*3/uL (ref 150–400)
RBC: 4.39 MIL/uL (ref 3.87–5.11)
RDW: 12.9 % (ref 11.5–15.5)
WBC: 6.6 10*3/uL (ref 4.0–10.5)
nRBC: 0 % (ref 0.0–0.2)

## 2023-02-10 LAB — COMPREHENSIVE METABOLIC PANEL
ALT: 17 U/L (ref 0–44)
AST: 20 U/L (ref 15–41)
Albumin: 4.2 g/dL (ref 3.5–5.0)
Alkaline Phosphatase: 42 U/L (ref 38–126)
Anion gap: 11 (ref 5–15)
BUN: 19 mg/dL (ref 8–23)
CO2: 21 mmol/L — ABNORMAL LOW (ref 22–32)
Calcium: 9.1 mg/dL (ref 8.9–10.3)
Chloride: 104 mmol/L (ref 98–111)
Creatinine, Ser: 1.06 mg/dL — ABNORMAL HIGH (ref 0.44–1.00)
GFR, Estimated: 52 mL/min — ABNORMAL LOW (ref >60)
Glucose, Bld: 90 mg/dL (ref 70–99)
Potassium: 4.6 mmol/L (ref 3.5–5.1)
Sodium: 136 mmol/L (ref 135–145)
Total Bilirubin: 0.7 mg/dL (ref 0.3–1.2)
Total Protein: 6.9 g/dL (ref 6.5–8.1)

## 2023-02-10 LAB — SURGICAL PCR SCREEN
MRSA, PCR: NEGATIVE
Staphylococcus aureus: NEGATIVE

## 2023-02-15 ENCOUNTER — Other Ambulatory Visit: Payer: Self-pay

## 2023-02-15 ENCOUNTER — Ambulatory Visit (HOSPITAL_BASED_OUTPATIENT_CLINIC_OR_DEPARTMENT_OTHER): Payer: Medicare HMO | Admitting: Certified Registered Nurse Anesthetist

## 2023-02-15 ENCOUNTER — Encounter (HOSPITAL_COMMUNITY): Payer: Self-pay | Admitting: Orthopedic Surgery

## 2023-02-15 ENCOUNTER — Ambulatory Visit (HOSPITAL_COMMUNITY): Payer: Medicare HMO | Admitting: Certified Registered Nurse Anesthetist

## 2023-02-15 ENCOUNTER — Encounter (HOSPITAL_COMMUNITY)
Admission: RE | Disposition: A | Discharge status: Home / Self Care | Payer: Self-pay | Source: Home / Self Care | Attending: Orthopedic Surgery

## 2023-02-15 ENCOUNTER — Observation Stay (HOSPITAL_COMMUNITY)
Admission: RE | Admit: 2023-02-15 | Discharge: 2023-02-17 | Disposition: A | Discharge status: Home / Self Care | Payer: Medicare HMO | Attending: Orthopedic Surgery | Admitting: Orthopedic Surgery

## 2023-02-15 DIAGNOSIS — I129 Hypertensive chronic kidney disease with stage 1 through stage 4 chronic kidney disease, or unspecified chronic kidney disease: Secondary | ICD-10-CM | POA: Insufficient documentation

## 2023-02-15 DIAGNOSIS — J45909 Unspecified asthma, uncomplicated: Secondary | ICD-10-CM

## 2023-02-15 DIAGNOSIS — M1712 Unilateral primary osteoarthritis, left knee: Secondary | ICD-10-CM | POA: Diagnosis present

## 2023-02-15 DIAGNOSIS — Z96659 Presence of unspecified artificial knee joint: Secondary | ICD-10-CM

## 2023-02-15 DIAGNOSIS — I10 Essential (primary) hypertension: Secondary | ICD-10-CM | POA: Diagnosis not present

## 2023-02-15 DIAGNOSIS — E039 Hypothyroidism, unspecified: Secondary | ICD-10-CM | POA: Insufficient documentation

## 2023-02-15 DIAGNOSIS — N189 Chronic kidney disease, unspecified: Secondary | ICD-10-CM | POA: Insufficient documentation

## 2023-02-15 HISTORY — PX: TOTAL KNEE ARTHROPLASTY: SHX125

## 2023-02-15 SURGERY — ARTHROPLASTY, KNEE, TOTAL
Anesthesia: Spinal | Site: Knee | Laterality: Left

## 2023-02-15 MED ORDER — SENNOSIDES-DOCUSATE SODIUM 8.6-50 MG PO TABS: 1.0000 | ORAL_TABLET | Freq: Every evening | ORAL | Status: DC | PRN

## 2023-02-15 MED ORDER — LACTATED RINGERS IV SOLN: INTRAVENOUS | Status: DC

## 2023-02-15 MED ORDER — MOMETASONE FURO-FORMOTEROL FUM 200-5 MCG/ACT IN AERO
2.0000 | INHALATION_SPRAY | Freq: Two times a day (BID) | RESPIRATORY_TRACT | Status: DC
  Administered 2023-02-15 – 2023-02-17 (×4): 2 via RESPIRATORY_TRACT
  Filled 2023-02-15: qty 8.8

## 2023-02-15 MED ORDER — CEFAZOLIN SODIUM-DEXTROSE 2-4 GM/100ML-% IV SOLN
2.0000 g | INTRAVENOUS | Status: AC
  Administered 2023-02-15: 2 g via INTRAVENOUS
  Filled 2023-02-15: qty 100

## 2023-02-15 MED ORDER — PHENYLEPHRINE HCL-NACL 20-0.9 MG/250ML-% IV SOLN
INTRAVENOUS | Status: DC | PRN
  Administered 2023-02-15: 30 ug/min via INTRAVENOUS

## 2023-02-15 MED ORDER — GABAPENTIN 300 MG PO CAPS
300.0000 mg | ORAL_CAPSULE | Freq: Once | ORAL | Status: AC
  Administered 2023-02-15: 300 mg via ORAL
  Filled 2023-02-15: qty 1

## 2023-02-15 MED ORDER — WATER FOR IRRIGATION, STERILE IR SOLN
Status: DC | PRN
  Administered 2023-02-15: 2000 mL

## 2023-02-15 MED ORDER — PROPOFOL 1000 MG/100ML IV EMUL
INTRAVENOUS | Status: AC
  Filled 2023-02-15: qty 100

## 2023-02-15 MED ORDER — SODIUM CHLORIDE (PF) 0.9 % IJ SOLN
INTRAMUSCULAR | Status: DC | PRN
  Administered 2023-02-15: 20 mL

## 2023-02-15 MED ORDER — IPRATROPIUM-ALBUTEROL 0.5-2.5 (3) MG/3ML IN SOLN
3.0000 mL | RESPIRATORY_TRACT | Status: AC
  Administered 2023-02-15: 3 mL via RESPIRATORY_TRACT

## 2023-02-15 MED ORDER — BUPIVACAINE LIPOSOME 1.3 % IJ SUSP: 20.0000 mL | Freq: Once | INTRAMUSCULAR | Status: DC

## 2023-02-15 MED ORDER — ONDANSETRON HCL 4 MG/2ML IJ SOLN
INTRAMUSCULAR | Status: AC
  Filled 2023-02-15: qty 2

## 2023-02-15 MED ORDER — SPIRONOLACTONE 25 MG PO TABS
50.0000 mg | ORAL_TABLET | Freq: Every day | ORAL | Status: DC
  Administered 2023-02-16 – 2023-02-17 (×2): 50 mg via ORAL
  Filled 2023-02-15 (×2): qty 2

## 2023-02-15 MED ORDER — LORAZEPAM 0.5 MG PO TABS
0.5000 mg | ORAL_TABLET | Freq: Every day | ORAL | Status: DC
  Administered 2023-02-16: 0.5 mg via ORAL
  Filled 2023-02-15: qty 1

## 2023-02-15 MED ORDER — SODIUM CHLORIDE (PF) 0.9 % IJ SOLN
INTRAMUSCULAR | Status: AC
  Filled 2023-02-15: qty 20

## 2023-02-15 MED ORDER — METOPROLOL TARTRATE 25 MG PO TABS: 25.0000 mg | ORAL_TABLET | Freq: Two times a day (BID) | ORAL | Status: DC | PRN

## 2023-02-15 MED ORDER — BUPIVACAINE LIPOSOME 1.3 % IJ SUSP
INTRAMUSCULAR | Status: DC | PRN
  Administered 2023-02-15: 20 mL

## 2023-02-15 MED ORDER — OXYCODONE HCL 5 MG/5ML PO SOLN: 5.0000 mg | Freq: Once | ORAL | Status: DC | PRN

## 2023-02-15 MED ORDER — HYDROMORPHONE HCL 1 MG/ML IJ SOLN
0.2500 mg | INTRAMUSCULAR | Status: DC | PRN
  Administered 2023-02-15: 0.5 mg via INTRAVENOUS

## 2023-02-15 MED ORDER — METOCLOPRAMIDE HCL 5 MG/ML IJ SOLN
5.0000 mg | Freq: Three times a day (TID) | INTRAMUSCULAR | Status: DC | PRN
  Administered 2023-02-15: 10 mg via INTRAVENOUS
  Filled 2023-02-15: qty 2

## 2023-02-15 MED ORDER — OLOPATADINE HCL 0.1 % OP SOLN
1.0000 [drp] | Freq: Two times a day (BID) | OPHTHALMIC | Status: DC
  Administered 2023-02-16 – 2023-02-17 (×2): 1 [drp] via OPHTHALMIC
  Filled 2023-02-15: qty 5

## 2023-02-15 MED ORDER — ALBUTEROL SULFATE HFA 108 (90 BASE) MCG/ACT IN AERS
2.0000 | INHALATION_SPRAY | RESPIRATORY_TRACT | Status: DC | PRN
  Administered 2023-02-16: 2 via RESPIRATORY_TRACT
  Filled 2023-02-15: qty 6.7

## 2023-02-15 MED ORDER — ONDANSETRON HCL 4 MG/2ML IJ SOLN
INTRAMUSCULAR | Status: DC | PRN
  Administered 2023-02-15: 4 mg via INTRAVENOUS

## 2023-02-15 MED ORDER — LORATADINE 10 MG PO TABS
10.0000 mg | ORAL_TABLET | Freq: Every day | ORAL | Status: DC
  Administered 2023-02-16 – 2023-02-17 (×2): 10 mg via ORAL
  Filled 2023-02-15 (×2): qty 1

## 2023-02-15 MED ORDER — POVIDONE-IODINE 10 % EX SWAB: 2.0000 | Freq: Once | CUTANEOUS | Status: DC

## 2023-02-15 MED ORDER — CHLORHEXIDINE GLUCONATE 0.12 % MT SOLN
15.0000 mL | Freq: Once | OROMUCOSAL | Status: AC
  Administered 2023-02-15: 15 mL via OROMUCOSAL

## 2023-02-15 MED ORDER — PANTOPRAZOLE SODIUM 40 MG PO TBEC
40.0000 mg | DELAYED_RELEASE_TABLET | Freq: Every day | ORAL | Status: DC
  Administered 2023-02-16 – 2023-02-17 (×2): 40 mg via ORAL
  Filled 2023-02-15 (×2): qty 1

## 2023-02-15 MED ORDER — OXYCODONE HCL 5 MG PO TABS: 5.0000 mg | ORAL_TABLET | Freq: Once | ORAL | Status: DC | PRN

## 2023-02-15 MED ORDER — SODIUM CHLORIDE 0.9 % IR SOLN
Status: DC | PRN
  Administered 2023-02-15: 1000 mL

## 2023-02-15 MED ORDER — ORAL CARE MOUTH RINSE: 15.0000 mL | Freq: Once | OROMUCOSAL | Status: AC

## 2023-02-15 MED ORDER — DIPHENHYDRAMINE HCL 12.5 MG/5ML PO ELIX: 12.5000 mg | ORAL_SOLUTION | ORAL | Status: DC | PRN

## 2023-02-15 MED ORDER — TRANEXAMIC ACID-NACL 1000-0.7 MG/100ML-% IV SOLN
1000.0000 mg | INTRAVENOUS | Status: AC
  Administered 2023-02-15: 1000 mg via INTRAVENOUS
  Filled 2023-02-15: qty 100

## 2023-02-15 MED ORDER — PHENOL 1.4 % MT LIQD: 1.0000 | OROMUCOSAL | Status: DC | PRN

## 2023-02-15 MED ORDER — DOCUSATE SODIUM 100 MG PO CAPS
100.0000 mg | ORAL_CAPSULE | Freq: Two times a day (BID) | ORAL | Status: DC
  Administered 2023-02-15 – 2023-02-17 (×4): 100 mg via ORAL
  Filled 2023-02-15 (×4): qty 1

## 2023-02-15 MED ORDER — BUPIVACAINE-EPINEPHRINE 0.25% -1:200000 IJ SOLN
INTRAMUSCULAR | Status: DC | PRN
  Administered 2023-02-15: 30 mL

## 2023-02-15 MED ORDER — HYDROMORPHONE HCL 1 MG/ML IJ SOLN
INTRAMUSCULAR | Status: AC
  Filled 2023-02-15: qty 1

## 2023-02-15 MED ORDER — FAMOTIDINE 20 MG PO TABS: 20.0000 mg | ORAL_TABLET | Freq: Two times a day (BID) | ORAL | Status: DC | PRN

## 2023-02-15 MED ORDER — BUPIVACAINE IN DEXTROSE 0.75-8.25 % IT SOLN
INTRATHECAL | Status: DC | PRN
  Administered 2023-02-15: 1.4 mL via INTRATHECAL

## 2023-02-15 MED ORDER — FLEET ENEMA 7-19 GM/118ML RE ENEM: 1.0000 | ENEMA | Freq: Once | RECTAL | Status: DC | PRN

## 2023-02-15 MED ORDER — ACETAMINOPHEN 500 MG PO TABS
500.0000 mg | ORAL_TABLET | Freq: Four times a day (QID) | ORAL | Status: AC
  Administered 2023-02-15 – 2023-02-16 (×4): 1000 mg via ORAL
  Filled 2023-02-15 (×4): qty 2

## 2023-02-15 MED ORDER — IPRATROPIUM-ALBUTEROL 0.5-2.5 (3) MG/3ML IN SOLN
RESPIRATORY_TRACT | Status: AC
  Filled 2023-02-15: qty 3

## 2023-02-15 MED ORDER — BUPIVACAINE HCL 0.25 % IJ SOLN
INTRAMUSCULAR | Status: AC
  Filled 2023-02-15: qty 1

## 2023-02-15 MED ORDER — BISACODYL 5 MG PO TBEC: 5.0000 mg | DELAYED_RELEASE_TABLET | Freq: Every day | ORAL | Status: DC | PRN

## 2023-02-15 MED ORDER — DEXAMETHASONE SODIUM PHOSPHATE 10 MG/ML IJ SOLN
INTRAMUSCULAR | Status: AC
  Filled 2023-02-15: qty 1

## 2023-02-15 MED ORDER — ONDANSETRON HCL 4 MG/2ML IJ SOLN
4.0000 mg | Freq: Four times a day (QID) | INTRAMUSCULAR | Status: DC | PRN
  Administered 2023-02-15: 4 mg via INTRAVENOUS
  Filled 2023-02-15: qty 2

## 2023-02-15 MED ORDER — BUPROPION HCL ER (XL) 150 MG PO TB24
150.0000 mg | ORAL_TABLET | Freq: Every day | ORAL | Status: DC
  Administered 2023-02-16 – 2023-02-17 (×2): 150 mg via ORAL
  Filled 2023-02-15 (×2): qty 1

## 2023-02-15 MED ORDER — FERROUS SULFATE 325 (65 FE) MG PO TABS
325.0000 mg | ORAL_TABLET | Freq: Three times a day (TID) | ORAL | Status: DC
  Administered 2023-02-16 – 2023-02-17 (×5): 325 mg via ORAL
  Filled 2023-02-15 (×5): qty 1

## 2023-02-15 MED ORDER — ONDANSETRON HCL 4 MG PO TABS: 4.0000 mg | ORAL_TABLET | Freq: Four times a day (QID) | ORAL | Status: DC | PRN

## 2023-02-15 MED ORDER — FENTANYL CITRATE PF 50 MCG/ML IJ SOSY
100.0000 ug | PREFILLED_SYRINGE | INTRAMUSCULAR | Status: DC
  Administered 2023-02-15: 50 ug via INTRAVENOUS
  Filled 2023-02-15: qty 2

## 2023-02-15 MED ORDER — ZOLPIDEM TARTRATE 5 MG PO TABS: 5.0000 mg | ORAL_TABLET | Freq: Every evening | ORAL | Status: DC | PRN

## 2023-02-15 MED ORDER — DM-GUAIFENESIN ER 30-600 MG PO TB12: 1.0000 | ORAL_TABLET | Freq: Two times a day (BID) | ORAL | Status: DC | PRN

## 2023-02-15 MED ORDER — ASPIRIN 81 MG PO CHEW
81.0000 mg | CHEWABLE_TABLET | Freq: Two times a day (BID) | ORAL | Status: DC
  Administered 2023-02-16 – 2023-02-17 (×3): 81 mg via ORAL
  Filled 2023-02-15 (×3): qty 1

## 2023-02-15 MED ORDER — LOSARTAN POTASSIUM 50 MG PO TABS
50.0000 mg | ORAL_TABLET | Freq: Every day | ORAL | Status: DC
  Administered 2023-02-16 – 2023-02-17 (×2): 50 mg via ORAL
  Filled 2023-02-15 (×2): qty 1

## 2023-02-15 MED ORDER — DEXAMETHASONE SODIUM PHOSPHATE 10 MG/ML IJ SOLN
10.0000 mg | Freq: Once | INTRAMUSCULAR | Status: AC
  Administered 2023-02-16: 10 mg via INTRAVENOUS
  Filled 2023-02-15: qty 1

## 2023-02-15 MED ORDER — DEXAMETHASONE SODIUM PHOSPHATE 10 MG/ML IJ SOLN
8.0000 mg | Freq: Once | INTRAMUSCULAR | Status: AC
  Administered 2023-02-15: 8 mg via INTRAVENOUS

## 2023-02-15 MED ORDER — ONDANSETRON HCL 4 MG/2ML IJ SOLN: 4.0000 mg | Freq: Once | INTRAMUSCULAR | Status: DC | PRN

## 2023-02-15 MED ORDER — VENLAFAXINE HCL 25 MG PO TABS
25.0000 mg | ORAL_TABLET | Freq: Every day | ORAL | Status: DC
  Administered 2023-02-16 – 2023-02-17 (×2): 25 mg via ORAL
  Filled 2023-02-15 (×3): qty 1

## 2023-02-15 MED ORDER — ALUM & MAG HYDROXIDE-SIMETH 200-200-20 MG/5ML PO SUSP: 30.0000 mL | ORAL | Status: DC | PRN

## 2023-02-15 MED ORDER — MENTHOL 3 MG MT LOZG: 1.0000 | LOZENGE | OROMUCOSAL | Status: DC | PRN

## 2023-02-15 MED ORDER — 0.9 % SODIUM CHLORIDE (POUR BTL) OPTIME
TOPICAL | Status: DC | PRN
  Administered 2023-02-15: 1000 mL

## 2023-02-15 MED ORDER — PROPOFOL 500 MG/50ML IV EMUL
INTRAVENOUS | Status: DC | PRN
  Administered 2023-02-15: 60 ug/kg/min via INTRAVENOUS

## 2023-02-15 MED ORDER — BUPIVACAINE LIPOSOME 1.3 % IJ SUSP
INTRAMUSCULAR | Status: AC
  Filled 2023-02-15: qty 20

## 2023-02-15 MED ORDER — MIDAZOLAM HCL 2 MG/2ML IJ SOLN
2.0000 mg | INTRAMUSCULAR | Status: DC
  Filled 2023-02-15: qty 2

## 2023-02-15 MED ORDER — ACETAMINOPHEN 500 MG PO TABS
1000.0000 mg | ORAL_TABLET | Freq: Once | ORAL | Status: AC
  Administered 2023-02-15: 1000 mg via ORAL
  Filled 2023-02-15: qty 2

## 2023-02-15 MED ORDER — LEVOTHYROXINE SODIUM 112 MCG PO TABS
112.0000 ug | ORAL_TABLET | Freq: Every day | ORAL | Status: DC
  Administered 2023-02-16 – 2023-02-17 (×2): 112 ug via ORAL
  Filled 2023-02-15 (×2): qty 1

## 2023-02-15 MED ORDER — SODIUM CHLORIDE 0.9 % IV SOLN: INTRAVENOUS | Status: DC

## 2023-02-15 MED ORDER — EPINEPHRINE PF 1 MG/ML IJ SOLN
INTRAMUSCULAR | Status: AC
  Filled 2023-02-15: qty 1

## 2023-02-15 MED ORDER — METOCLOPRAMIDE HCL 5 MG PO TABS: 5.0000 mg | ORAL_TABLET | Freq: Three times a day (TID) | ORAL | Status: DC | PRN

## 2023-02-15 MED ORDER — TRAMADOL HCL 50 MG PO TABS
50.0000 mg | ORAL_TABLET | Freq: Four times a day (QID) | ORAL | Status: DC
  Administered 2023-02-16 – 2023-02-17 (×6): 100 mg via ORAL
  Filled 2023-02-15 (×6): qty 2

## 2023-02-15 SURGICAL SUPPLY — 58 items
ARTISURF 11M PLY L 6-9EF KNEE (Knees) IMPLANT
BAG COUNTER SPONGE SURGICOUNT (BAG) IMPLANT
BAG SPEC THK2 15X12 ZIP CLS (MISCELLANEOUS) ×1
BAG SPNG CNTER NS LX DISP (BAG) ×1
BAG ZIPLOCK 12X15 (MISCELLANEOUS) ×1 IMPLANT
BLADE SAGITTAL 13X1.27X60 (BLADE) ×1 IMPLANT
BLADE SAW SGTL 18X1.27X75 (BLADE) ×1 IMPLANT
BLADE SURG 15 STRL LF DISP TIS (BLADE) ×1 IMPLANT
BLADE SURG 15 STRL SS (BLADE) ×1
BNDG CMPR 5X62 HK CLSR LF (GAUZE/BANDAGES/DRESSINGS) ×1
BNDG CMPR MED 10X6 ELC LF (GAUZE/BANDAGES/DRESSINGS) ×1
BNDG ELASTIC 6INX 5YD STR LF (GAUZE/BANDAGES/DRESSINGS) ×1 IMPLANT
BNDG ELASTIC 6X10 VLCR STRL LF (GAUZE/BANDAGES/DRESSINGS) IMPLANT
BOWL SMART MIX CTS (DISPOSABLE) ×1 IMPLANT
BSPLAT TIB 5D E CMNT STM LT (Knees) ×1 IMPLANT
CEMENT BONE R 1X40 (Cement) ×2 IMPLANT
CLSR STERI-STRIP ANTIMIC 1/2X4 (GAUZE/BANDAGES/DRESSINGS) IMPLANT
COVER SURGICAL LIGHT HANDLE (MISCELLANEOUS) ×1 IMPLANT
CUFF TOURN SGL QUICK 34 (TOURNIQUET CUFF) ×1
CUFF TRNQT CYL 34X4.125X (TOURNIQUET CUFF) ×1 IMPLANT
DRAPE INCISE IOBAN 66X45 STRL (DRAPES) ×2 IMPLANT
DRAPE U-SHAPE 47X51 STRL (DRAPES) ×1 IMPLANT
DRSG AQUACEL AG ADV 3.5X 6 (GAUZE/BANDAGES/DRESSINGS) IMPLANT
DRSG AQUACEL AG ADV 3.5X10 (GAUZE/BANDAGES/DRESSINGS) ×1 IMPLANT
DURAPREP 26ML APPLICATOR (WOUND CARE) ×2 IMPLANT
ELECT REM PT RETURN 15FT ADLT (MISCELLANEOUS) ×1 IMPLANT
FEMUR CMT CCR STD SZ6 L KNEE (Knees) ×1 IMPLANT
FEMUR CMTD CCR STD SZ6 L KNEE (Knees) IMPLANT
GLOVE BIOGEL M 7.0 STRL (GLOVE) IMPLANT
GLOVE BIOGEL PI IND STRL 7.5 (GLOVE) IMPLANT
GLOVE BIOGEL PI IND STRL 8.5 (GLOVE) ×1 IMPLANT
GLOVE SURG ORTHO 8.0 STRL STRW (GLOVE) ×2 IMPLANT
GOWN STRL REUS W/ TWL XL LVL3 (GOWN DISPOSABLE) ×2 IMPLANT
GOWN STRL REUS W/TWL XL LVL3 (GOWN DISPOSABLE) ×2
HANDPIECE INTERPULSE COAX TIP (DISPOSABLE) ×1
HOLDER FOLEY CATH W/STRAP (MISCELLANEOUS) ×1 IMPLANT
HOOD PEEL AWAY T7 (MISCELLANEOUS) ×3 IMPLANT
KIT TURNOVER KIT A (KITS) IMPLANT
MANIFOLD NEPTUNE II (INSTRUMENTS) ×1 IMPLANT
NS IRRIG 1000ML POUR BTL (IV SOLUTION) ×1 IMPLANT
PACK TOTAL KNEE CUSTOM (KITS) ×1 IMPLANT
PROTECTOR NERVE ULNAR (MISCELLANEOUS) ×1 IMPLANT
SET HNDPC FAN SPRY TIP SCT (DISPOSABLE) ×1 IMPLANT
SPIKE FLUID TRANSFER (MISCELLANEOUS) ×2 IMPLANT
STEM POLY PAT PLY 29M KNEE (Knees) IMPLANT
STEM TIBIA 5 DEG SZ E L KNEE (Knees) IMPLANT
STRIP CLOSURE SKIN 1/2X4 (GAUZE/BANDAGES/DRESSINGS) ×1 IMPLANT
SUT BONE WAX W31G (SUTURE) ×1 IMPLANT
SUT MNCRL AB 3-0 PS2 18 (SUTURE) ×1 IMPLANT
SUT STRATAFIX 0 PDS 27 VIOLET (SUTURE) ×1
SUT STRATAFIX 1PDS 45CM VIOLET (SUTURE) ×1 IMPLANT
SUT VIC AB 1 CT1 36 (SUTURE) ×1 IMPLANT
SUTURE STRATFX 0 PDS 27 VIOLET (SUTURE) ×1 IMPLANT
TIBIA STEM 5 DEG SZ E L KNEE (Knees) ×1 IMPLANT
TRAY FOLEY MTR SLVR 16FR STAT (SET/KITS/TRAYS/PACK) ×1 IMPLANT
TUBE SUCTION HIGH CAP CLEAR NV (SUCTIONS) ×1 IMPLANT
WATER STERILE IRR 1000ML POUR (IV SOLUTION) ×2 IMPLANT
WRAP KNEE MAXI GEL POST OP (GAUZE/BANDAGES/DRESSINGS) ×2 IMPLANT

## 2023-02-15 NOTE — Progress Notes (Signed)
Pac[facility] RN Report to floor given  Gave report to  Amy RN. Room: 1323   Discussed surgery, meds given in OR and [facility], VS, IV fluids given, EBL, urine output, pain and other pertinent information. Also discussed if pt had any family or friends here or belongings with them.    Pt very sleepy, more awake now, falls to sleep quickly and easily arousable. Moving bilat LE, able to bend knees, lift hips. No pain, spinal wearing off. L knee ice wrap on. Ortho tech placed CPM machine on in [facility] (0-90 deg), call if it is too painful for pt. [phone]  Pt LS mostly clear, some expiratory wheeze noted, she has sleep apnea also. Will ask Anesthesia if they want a treatment.   Daughter Leotis Shames was updated. Pt report called, pt can go up at 1315. RN just received another pt. Giving her a little longer to get settled. Will update daughter with room and time she can go up.   Pt exits my care.

## 2023-02-15 NOTE — Anesthesia Procedure Notes (Signed)
Procedure Name: MAC Date/Time: 02/15/2023 9:30 AM  Performed by: Vanessa Richland, CRNAPre-anesthesia Checklist: Patient identified, Emergency Drugs available, Suction available and Patient being monitored Patient Re-evaluated:Patient Re-evaluated prior to induction Oxygen Delivery Method: Simple face mask

## 2023-02-15 NOTE — Evaluation (Signed)
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 Pt has PMH including but not limited to: asthma, angina with exertion, rhinitis, anemia, CKD, HTN, and  hypothyroidism.  Clinical Impression    Jeanne Ortiz is a 82 y.o. female POD 0 s/p L TKA. Patient reports mod I with SPC for mobility at baseline. Patient is now limited by functional impairments (see PT problem list below) and requires min guard for supine to sit and min A for LLE for sit to supine for bed mobility. Due to bouts of N and V PT unable to assess  transfers or gait tasks at time of evaluation. Patient instructed in exercise to facilitate ROM and circulation to manage edema. Patient will benefit from continued skilled PT interventions to address impairments and progress towards PLOF. Acute PT will follow to progress mobility and stair training in preparation for safe discharge home with family support and OPPT services.      Recommendations for follow up therapy are one component of a multi-disciplinary discharge planning process, led by the attending physician.  Recommendations may be updated based on patient status, additional functional criteria and insurance authorization.  Follow Up Recommendations       Assistance Recommended at Discharge Intermittent Supervision/Assistance  Patient can return home with the following  A little help with walking and/or transfers;A little help with bathing/dressing/bathroom;Assistance with cooking/housework;Assist for transportation;Help with stairs or ramp for entrance    Equipment Recommendations Rolling walker (2 wheels)  Recommendations for Other Services       Functional Status Assessment Patient has had a recent decline in their functional status and demonstrates the ability to make  significant improvements in function in a reasonable and predictable amount of time.     Precautions / Restrictions Precautions Precautions: Knee;Fall Restrictions Weight Bearing Restrictions: No      Mobility  Bed Mobility Overal bed mobility: Needs Assistance Bed Mobility: Supine to Sit, Sit to Supine     Supine to sit: Min guard, HOB elevated Sit to supine: Min assist, HOB elevated (LLE)   General bed mobility comments: cues for scooting LLE to EOB and pt required A for LLE to return to bed, pt became nausated seated EOB    Transfers                   General transfer comment: NT due to active vomiting    Ambulation/Gait               General Gait Details: NT  Stairs            Wheelchair Mobility    Modified Rankin (Stroke Patients Only)       Balance                                             Pertinent Vitals/Pain Pain Assessment Pain Assessment: 0-10 Pain Score: 5  Pain Location: L knee (posterior) Pain Descriptors / Indicators: Aching, Constant, Discomfort, Operative site guarding Pain Intervention(s): Limited activity within patient's tolerance, Monitored during session, Repositioned, Ice applied, Premedicated before session    Home Living Family/patient expects to be discharged to:: Private residence Living Arrangements: Other relatives Available Help at Discharge: Family (daughter from  [place] will be with pt for a few wks. lives with other family in an onsuite) Type of Home: House Home Access: Stairs to enter Entrance Stairs-Rails: Right;Left;Can reach both Secretary/administrator of Steps: 4   Home Layout: One level Home Equipment: Cane - single point      Prior Function Prior Level of Function : Independent/Modified Independent             Mobility Comments: amb with SPC in mod I for ADLs, self care tasks       Hand Dominance        Extremity/Trunk Assessment        Lower Extremity  Assessment Lower Extremity Assessment: LLE deficits/detail LLE Deficits / Details: ankle DF/PF 5/5; SLR < 10 degree lag LLE Sensation: WNL    Cervical / Trunk Assessment Cervical / Trunk Assessment:  (wfl)  Communication   Communication: No difficulties  Cognition Arousal/Alertness: Lethargic, Suspect due to medications Behavior During Therapy: WFL for tasks assessed/performed Overall Cognitive Status: Within Functional Limits for tasks assessed                                          General Comments      Exercises Total Joint Exercises Ankle Circles/Pumps: AROM, Both, 20 reps   Assessment/Plan    PT Assessment Patient needs continued PT services  PT Problem List Decreased strength;Decreased range of motion;Decreased activity tolerance;Decreased balance;Decreased coordination;Decreased mobility;Pain       PT Treatment Interventions DME instruction;Gait training;Stair training;Functional mobility training;Therapeutic activities;Therapeutic exercise;Balance training;Neuromuscular re-education;Patient/family education;Modalities    PT Goals (Current goals can be found in the Care Plan section)  Acute Rehab PT Goals Patient Stated Goal: to be more active with the great grandchildren especially out side and to be able to walk PT Goal Formulation: With patient Time For Goal Achievement: 03/01/23 Potential to Achieve Goals: Good    Frequency 7X/week     Co-evaluation               AM-PAC PT "6 Clicks" Mobility  Outcome Measure Help needed turning from your back to your side while in a flat bed without using bedrails?: A Little Help needed moving from lying on your back to sitting on the side of a flat bed without using bedrails?: A Little Help needed moving to and from a bed to a chair (including a wheelchair)?: A Little Help needed standing up from a chair using your arms (e.g., wheelchair or bedside chair)?: A Lot Help needed to walk in hospital  room?: A Lot Help needed climbing 3-5 steps with a railing? : A Lot 6 Click Score: 15    End of Session   Activity Tolerance: Treatment limited secondary to medical complications (Comment) (N and V s/p nausea medication) Patient left: in bed;with call bell/phone within reach;with family/visitor present Nurse Communication: Mobility status;Other (comment) (N and V) PT Visit Diagnosis: Unsteadiness on feet (R26.81);Other abnormalities of gait and mobility (R26.89);Muscle weakness (generalized) (M62.81);Pain Pain - Right/Left: Left Pain - part of body: Knee    Time: 1649-1710 PT Time Calculation (min) (ACUTE ONLY): 21 min   Charges:   PT Evaluation $PT Eval Low Complexity: 1 Low          Johnny Bridge, PT Acute Rehab   Jacqualyn Posey 02/15/2023, 5:53 PM

## 2023-02-15 NOTE — Op Note (Signed)
TOTAL KNEE REPLACEMENT OPERATIVE NOTE:  02/15/2023  11:28 AM  PATIENT:  Jeanne Ortiz  82 y.o. female  PRE-OPERATIVE DIAGNOSIS:  Osteoarthritis of left knee M17.12  POST-OPERATIVE DIAGNOSIS:  Osteoarthritis of left knee M17.12  PROCEDURE:  Procedure(s): TOTAL KNEE ARTHROPLASTY  SURGEON:  Surgeon(s): Dannielle Huh, MD  PHYSICIAN ASSISTANT: [name], PA-C   ANESTHESIA:   spinal  SPECIMEN: None  COUNTS:  Correct  TOURNIQUET:   Total Tourniquet Time Documented: Thigh (Left) - 42 minutes Total: Thigh (Left) - 42 minutes   DICTATION:  Indication for procedure:    The patient is a 82 y.o. female who has failed conservative treatment for Osteoarthritis of left knee M17.12.  Informed consent was obtained prior to anesthesia. The risks versus benefits of the operation were explain and in a way the patient can, and did, understand.    Description of procedure:     The patient was taken to the operating room and placed under anesthesia.  The patient was positioned in the usual fashion taking care that all body parts were adequately padded and/or protected.  A tourniquet was applied and the leg prepped and draped in the usual sterile fashion.  The extremity was exsanguinated with the esmarch and tourniquet inflated to 300 mmHg.  Pre-operative range of motion was normal.    A midline incision approximately 6-7 inches long was made with a #10 blade.  A new blade was used to make a parapatellar arthrotomy going 2-3 cm into the quadriceps tendon, over the patella, and alongside the medial aspect of the patellar tendon.  A synovectomy was then performed with the #10 blade and forceps. I then elevated the deep MCL off the medial tibial metaphysis subperiosteally around to the semimembranosus attachment.    I everted the patella and used calipers to measure patellar thickness.  I used the reamer to ream down to appropriate thickness to recreate the native thickness.  I then removed  excess bone with the rongeur and sagittal saw.  I used the appropriately sized template and drilled the three lug holes.  I then put the trial in place and measured the thickness with the calipers to ensure recreation of the native thickness.  The trial was then removed and the patella subluxed and the knee brought into flexion.  A homan retractor was place to retract and protect the patella and lateral structures.  A Z-retractor was place medially to protect the medial structures.  The extra-medullary alignment system was used to make cut the tibial articular surface perpendicular to the anamotic axis of the tibia and in 3 degrees of posterior slope.  The cut surface and alignment jig was removed.  I then used the intramedullary alignment guide to make a 4 valgus cut on the distal femur.  I then marked out the epicondylar axis on the distal femur.   I then used the anterior referencing sizer and measured the femur to be a size 6.  The 4-In-1 cutting block was screwed into place in external rotation matching the posterior condylar angle, making our cuts perpendicular to the epicondylar axis.  Anterior, posterior and chamfer cuts were made with the sagittal saw.  The cutting block and cut pieces were removed.  A lamina spreader was placed in 90 degrees of flexion.  The ACL, PCL, menisci, and posterior condylar osteophytes were removed.  A 11 mm spacer blocked was found to offer good flexion and extension gap balance after minimal in degree releasing.   The scoop retractor was  then placed and the femoral finishing block was pinned in place.  The small sagittal saw was used as well as the lug drill to finish the femur.  The block and cut surfaces were removed and the medullary canal hole filled with autograft bone from the cut pieces.  The tibia was delivered forward in deep flexion and external rotation.  A size E tray was selected and pinned into place centered on the medial 1/3 of the tibial tubercle.  The  reamer and keel was used to prepare the tibia through the tray.    I then trialed with the size 6 femur, size E tibia, a 11 mm insert and the 29 patella.  I had excellent flexion/extension gap balance, excellent patella tracking.  Flexion was full and beyond 120 degrees; extension was zero.  These components were chosen and the staff opened them to me on the back table while the knee was lavaged copiously and the cement mixed.  The soft tissue was infiltrated with 60cc of exparel 1.3% through a 21 gauge needle.  I cemented in the components and removed all excess cement.  The polyethylene tibial component was snapped into place and the knee placed in extension while cement was hardening.  The capsule was infilltrated with a 60cc exparel/marcaine/saline mixture.   Once the cement was hard, the tourniquet was let down.  Hemostasis was obtained.  The arthrotomy was closed using a #1 stratofix running suture.  The deep soft tissues were closed with #0 vicryls and the subcuticular layer closed with #2-0 vicryl.  The skin was reapproximated and closed with 3.0 Monocryl.  The wound was covered with steristrips, aquacel dressing, and a TED stocking.   The patient was then awakened, extubated, and taken to the recovery room in stable condition.  BLOOD LOSS:  300cc COMPLICATIONS:  None.  PLAN OF CARE: Admit for overnight observation  PATIENT DISPOSITION:  PACU - hemodynamically stable.   Delay start of Pharmacological VTE agent (>24hrs) due to surgical blood loss or risk of bleeding:  yes  Please fax a copy of this op note to my office at [phone] (please only include page 1 and 2 of the Case Information op note)

## 2023-02-15 NOTE — H&P (Signed)
JESSA BASORE MRN:  [id number] DOB/SEX:  1941-02-09/female  CHIEF COMPLAINT:  Painful left Knee  HISTORY: Patient is a 82 y.o. female presented with a history of pain in the left knee. Onset of symptoms was gradual starting a few years ago with gradually worsening course since that time. Patient has been treated conservatively with over-the-counter NSAIDs and activity modification. Patient currently rates pain in the knee at 10 out of 10 with activity. There is pain at night.  PAST MEDICAL HISTORY: Patient Active Problem List   Diagnosis Date Noted   Pre-operative respiratory examination 12/15/2022   Asthma exacerbation 08/12/2021   Exertional chest pain 06/09/2021   Rhinitis, chronic 05/31/2020   Cough variant asthma 11/16/2019   Past Medical History:  Diagnosis Date   Anemia    Anxiety    Arthritis    Asthma    Chronic kidney disease    Depression    GERD (gastroesophageal reflux disease)    Headache    migraines,  seldom since menopause   Hypertension    Hypothyroidism    Pneumonia    PUD (peptic ulcer disease)    Thyroid disease    Past Surgical History:  Procedure Laterality Date   ABDOMINAL HYSTERECTOMY     CATARACT EXTRACTION Bilateral    w/ IOL     MEDICATIONS:   No medications prior to admission.    ALLERGIES:   Allergies  Allergen Reactions   Fentanyl And Related Nausea And Vomiting    Severe nausea and vomiting and severe migraine headache.   Morphine And Codeine Nausea And Vomiting    ANY NARCOTIC causes severe nausea and vomiting and severe migraine headache.   Other     brazile and macadamia nuts - migraines    Propofol Nausea And Vomiting   Sulfa Antibiotics Hives and Swelling    REVIEW OF SYSTEMS:  A comprehensive review of systems was negative except for: Musculoskeletal: positive for arthralgias and bone pain   FAMILY HISTORY:   Family History  Problem Relation Age of Onset   Stroke Father    Diabetes Father     SOCIAL HISTORY:    Social History   Tobacco Use   Smoking status: Never   Smokeless tobacco: Never  Substance Use Topics   Alcohol use: Never     EXAMINATION:  Vital signs in last 24 hours:    There were no vitals taken for this visit.  General Appearance:    Alert, cooperative, no distress, appears stated age  Head:    Normocephalic, without obvious abnormality, atraumatic  Eyes:    PERRL, conjunctiva/corneas clear, EOM's intact, fundi    benign, both eyes  Ears:    Normal TM's and external ear canals, both ears  Nose:   Nares normal, septum midline, mucosa normal, no drainage    or sinus tenderness  Throat:   Lips, mucosa, and tongue normal; teeth and gums normal  Neck:   Supple, symmetrical, trachea midline, no adenopathy;    thyroid:  no enlargement/tenderness/nodules; no carotid   bruit or JVD  Back:     Symmetric, no curvature, ROM normal, no CVA tenderness  Lungs:     Clear to auscultation bilaterally, respirations unlabored  Chest Wall:    No tenderness or deformity   Heart:    Regular rate and rhythm, S1 and S2 normal, no murmur, rub   or gallop  Breast Exam:    No tenderness, masses, or nipple abnormality  Abdomen:     Soft,  non-tender, bowel sounds active all four quadrants,    no masses, no organomegaly  Genitalia:    Normal female without lesion, discharge or tenderness  Rectal:    Normal tone, no masses or tenderness;   guaiac negative stool  Extremities:   Extremities normal, atraumatic, no cyanosis or edema  Pulses:   2+ and symmetric all extremities  Skin:   Skin color, texture, turgor normal, no rashes or lesions  Lymph nodes:   Cervical, supraclavicular, and axillary nodes normal  Neurologic:   CNII-XII intact, normal strength, sensation and reflexes    throughout    Musculoskeletal:  ROM 0-120, Ligaments intact,  Imaging Review Plain radiographs demonstrate severe degenerative joint disease of the left knee. The overall alignment is neutral. The bone quality appears  to be good for age and reported activity level.  Assessment/Plan: Primary osteoarthritis, left knee   The patient history, physical examination and imaging studies are consistent with advanced degenerative joint disease of the left knee. The patient has failed conservative treatment.  The clearance notes were reviewed.  After discussion with the patient it was felt that Total Knee Replacement was indicated. The procedure,  risks, and benefits of total knee arthroplasty were presented and reviewed. The risks including but not limited to aseptic loosening, infection, blood clots, vascular injury, stiffness, patella tracking problems complications among others were discussed. The patient acknowledged the explanation, agreed to proceed with the plan.  Preoperative templating of the joint replacement has been completed, documented, and submitted to the Operating Room personnel in order to optimize intra-operative equipment management.    Patient's anticipated LOS is less than 2 midnights, meeting these requirements: - Lives within 1 hour of care - Has a competent adult at home to recover with post-op recover - NO history of  - Chronic pain requiring opiods  - Diabetes  - Coronary Artery Disease  - Heart failure  - Heart attack  - Stroke  - DVT/VTE  - Cardiac arrhythmia  - Respiratory Failure/COPD  - Renal failure  - Anemia  - Advanced Liver disease     Guy Sandifer 02/15/2023, 7:05 AM

## 2023-02-15 NOTE — Transfer of Care (Signed)
Immediate Anesthesia Transfer of Care Note  Patient: Florella Cleda Mccreedy  Procedure(s) Performed: TOTAL KNEE ARTHROPLASTY (Left: Knee)  Patient Location: PACU  Anesthesia Type:MAC combined with regional for post-op pain  Level of Consciousness: awake and patient cooperative  Airway & Oxygen Therapy: Patient Spontanous Breathing and Patient connected to face mask  Post-op Assessment: Report given to RN and Post -op Vital signs reviewed and stable  Post vital signs: Reviewed and stable  Last Vitals:  Vitals Value Taken Time  BP 139/62 02/15/23 1117  Temp    Pulse 67 02/15/23 1119  Resp 18 02/15/23 1119  SpO2 100 % 02/15/23 1119  Vitals shown include unvalidated device data.  Last Pain:  Vitals:   02/15/23 0908  TempSrc:   PainSc: 0-No pain         Complications: No notable events documented.

## 2023-02-15 NOTE — Progress Notes (Addendum)
Started sneezing on Friday was seen for pre-op with [facility], patient stated that colby stated that as long as she didn't have a fever she could still come on in for surgery today.  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  She also reported that she is blowing out yellow snot from her nose.     Patient is wanting to know should she still come in for surgery this morning,  Patient instructed that I will need to reach out to the PA and to anesthesia for directions.  I told patient that I will attempt to call PA at 0600 and will follow up with anesthesia when they arrive today.    6:10 AM Spoke with [place] PA.  He stated that as long as the patient is feeling well and not having a fever it is ok for her to have her surgery today.  But ultimately it would be her decision if she wanted to come in for her surgery.    I will still need to update anesthesia on this information,  I spoke with the patient and she is planning to come in today since [place] PA gave his blessing that it would be ok.  I did let her know that I will still need to discuss with anesthesia and if there is a different response from them I would let her know.   6:49 AM Spoke with Dr Okey Dupre, ok to proceed with procedure today

## 2023-02-15 NOTE — Progress Notes (Signed)
Orthopedic Tech Progress Note Patient Details:  Jeanne Ortiz 24-Sep-1940 [id number]  CPM Left Knee CPM Left Knee: On Left Knee Flexion (Degrees): 0 Left Knee Extension (Degrees): 90  Post Interventions Patient Tolerated: Well Instructions Provided: Care of device, Adjustment of device  Sherilyn Banker 02/15/2023, 12:20 PM

## 2023-02-15 NOTE — Progress Notes (Signed)
Orthopedic Tech Progress Note Patient Details:  Jeanne Ortiz 1941-08-15 [id number]  Delivered bone foam to pt room. Ortho Devices Type of Ortho Device: Bone foam zero knee Ortho Device/Splint Interventions: Ordered   Post Interventions Patient Tolerated: Well Instructions Provided: Care of device, Adjustment of device  Sherilyn Banker 02/15/2023, 2:33 PM

## 2023-02-15 NOTE — Anesthesia Procedure Notes (Signed)
Spinal  Patient location during procedure: OR Start time: 02/15/2023 9:18 AM End time: 02/15/2023 9:23 AM Reason for block: surgical anesthesia Staffing Performed: anesthesiologist  Anesthesiologist: Eilene Ghazi, MD Performed by: Eilene Ghazi, MD Authorized by: Eilene Ghazi, MD   Preanesthetic Checklist Completed: patient identified, IV checked, site marked, risks and benefits discussed, surgical consent, monitors and equipment checked, pre-op evaluation and timeout performed Spinal Block Patient position: sitting Prep: Betadine Patient monitoring: heart rate, continuous pulse ox and blood pressure Approach: midline Location: L3-4 Injection technique: single-shot Needle Needle type: Sprotte  Needle gauge: 24 G Needle length: 9 cm Assessment Sensory level: T6 Events: CSF return Additional Notes

## 2023-02-15 NOTE — Anesthesia Preprocedure Evaluation (Signed)
Anesthesia Evaluation  Patient identified by MRN, date of birth, ID band Patient awake    Reviewed: Allergy & Precautions, H&P , NPO status , Patient's Chart, lab work & pertinent test results  Airway Mallampati: II  TM Distance: >3 FB Neck ROM: Full    Dental no notable dental hx.    Pulmonary asthma    Pulmonary exam normal breath sounds clear to auscultation       Cardiovascular hypertension, Pt. on medications and Pt. on home beta blockers Normal cardiovascular exam Rhythm:Regular Rate:Normal     Neuro/Psych negative neurological ROS  negative psych ROS   GI/Hepatic Neg liver ROS, PUD,GERD  ,,  Endo/Other  Hypothyroidism  Morbid obesity  Renal/GU Renal InsufficiencyRenal disease  negative genitourinary   Musculoskeletal  (+) Arthritis , Osteoarthritis,    Abdominal   Peds negative pediatric ROS (+)  Hematology negative hematology ROS (+)   Anesthesia Other Findings   Reproductive/Obstetrics negative OB ROS                             Anesthesia Physical Anesthesia Plan  ASA: 3  Anesthesia Plan: Spinal   Post-op Pain Management: Regional block*   Induction: Intravenous  PONV Risk Score and Plan: 2 and Propofol infusion, Treatment may vary due to age or medical condition, Ondansetron and Dexamethasone  Airway Management Planned: Simple Face Mask  Additional Equipment:   Intra-op Plan:   Post-operative Plan:   Informed Consent: I have reviewed the patients History and Physical, chart, labs and discussed the procedure including the risks, benefits and alternatives for the proposed anesthesia with the patient or authorized representative who has indicated his/her understanding and acceptance.     Dental advisory given  Plan Discussed with: CRNA and Surgeon  Anesthesia Plan Comments:        Anesthesia Quick Evaluation

## 2023-02-16 ENCOUNTER — Encounter (HOSPITAL_COMMUNITY): Payer: Self-pay | Admitting: Orthopedic Surgery

## 2023-02-16 DIAGNOSIS — M1712 Unilateral primary osteoarthritis, left knee: Secondary | ICD-10-CM | POA: Diagnosis not present

## 2023-02-16 NOTE — Anesthesia Postprocedure Evaluation (Signed)
Anesthesia Post Note  Patient: Jeanne Ortiz  Procedure(s) Performed: TOTAL KNEE ARTHROPLASTY (Left: Knee)     Patient location during evaluation: PACU Anesthesia Type: Spinal Level of consciousness: oriented and awake and alert Pain management: pain level controlled Vital Signs Assessment: post-procedure vital signs reviewed and stable Respiratory status: spontaneous breathing, respiratory function stable and patient connected to nasal cannula oxygen Cardiovascular status: blood pressure returned to baseline and stable Postop Assessment: no headache, no backache and no apparent nausea or vomiting Anesthetic complications: no  No notable events documented.  Last Vitals:  Vitals:   02/16/23 0622 02/16/23 0916  BP: (!) 118/59   Pulse: 63   Resp: 17   Temp: 36.7 C   SpO2: 100% 95%    Last Pain:  Vitals:   02/16/23 0622  TempSrc: Oral  PainSc: 6                  [name]

## 2023-02-16 NOTE — Plan of Care (Signed)
  Problem: Education: Goal: Knowledge of the prescribed therapeutic regimen will improve Outcome: Progressing   Problem: Activity: Goal: Ability to avoid complications of mobility impairment will improve Outcome: Progressing   Problem: Pain Management: Goal: Pain level will decrease with appropriate interventions Outcome: Progressing   

## 2023-02-16 NOTE — Progress Notes (Signed)
SPORTS MEDICINE AND JOINT REPLACEMENT  [name], MD    [name], PA-C [address] [place], [place], [place]  [address]                             [phone]   PROGRESS NOTE  Subjective:  negative for Chest Pain  negative for Shortness of Breath  negative for Nausea/Vomiting   negative for Calf Pain  negative for Bowel Movement   Tolerating Diet: yes         Patient reports pain as 5 on 0-10 scale.    Objective: Vital signs in last 24 hours:   Patient Vitals for the past 24 hrs:  BP Temp Temp src Pulse Resp SpO2  02/16/23 1432 109/78 98.5 F (36.9 C) Oral 80 18 96 %  02/16/23 1051 (!) 123/57 98.7 F (37.1 C) Oral 70 18 99 %  02/16/23 0916 -- -- -- -- -- 95 %  02/16/23 0622 (!) 118/59 98.1 F (36.7 C) Oral 63 17 100 %  02/16/23 0147 121/68 (!) 97.5 F (36.4 C) Oral 69 17 100 %  02/15/23 2300 -- -- -- -- -- 96 %  02/15/23 2108 127/62 97.6 F (36.4 C) Oral 61 17 94 %  02/15/23 1823 -- -- -- -- -- 93 %  02/15/23 1747 (!) 143/75 (!) 96.2 F (35.7 C) -- 70 14 97 %    @flow {1959:LAST@   Intake/Output from previous day:   06/03 0701 - 06/04 0700 In: 2545.1 [P.O.:480; I.V.:1865.1] Out: 1650 [Urine:1350]   Intake/Output this shift:   06/04 0701 - 06/04 1900 In: 458.7 [P.O.:240; I.V.:218.7] Out: -    Intake/Output      06/03 0701 06/04 0700 06/04 0701 06/05 0700   P.O. 480 240   I.V. (mL/kg) 1865.1 (18.5) 218.7 (2.2)   IV Piggyback 200    Total Intake(mL/kg) 2545.1 (25.3) 458.7 (4.6)   Urine (mL/kg/hr) 1350    Blood 300    Total Output 1650    Net +895.1 +458.7        Urine Occurrence  1 x      LABORATORY DATA: Recent Labs    02/10/23 1144  WBC 6.6  HGB 13.0  HCT 39.5  PLT 197   Recent Labs    02/10/23 1144  NA 136  K 4.6  CL 104  CO2 21*  BUN 19  CREATININE 1.06*  GLUCOSE 90  CALCIUM 9.1   No results found for: "INR", "PROTIME"  Examination:  General appearance: alert, cooperative, and no distress Extremities: extremities  normal, atraumatic, no cyanosis or edema  Wound Exam: clean, dry, intact   Drainage:  None: wound tissue dry  Motor Exam: Quadriceps and Hamstrings Intact  Sensory Exam: Superficial Peroneal, Deep Peroneal, and Tibial normal   Assessment:    1 Day Post-Op  Procedure(s) (LRB): TOTAL KNEE ARTHROPLASTY (Left)  ADDITIONAL DIAGNOSIS:  Principal Problem:   S/P total knee replacement     Plan: Physical Therapy as ordered Weight Bearing as Tolerated (WBAT)  DVT Prophylaxis:  Aspirin  DISCHARGE PLAN: Home  Patient doing well, much improved since yesterday. Plan on keeping tonight and D/C home tomorrow        Patient's anticipated LOS is less than 2 midnights, meeting these requirements: - Lives within 1 hour of care - Has a competent adult at home to recover with post-op recover - NO history of  - Chronic pain requiring opiods  - Diabetes  -  Coronary Artery Disease  - Heart failure  - Heart attack  - Stroke  - DVT/VTE  - Cardiac arrhythmia  - Respiratory Failure/COPD  - Renal failure  - Anemia  - Advanced Liver disease      Jeanne Ortiz 02/16/2023, 4:58 PM

## 2023-02-16 NOTE — Progress Notes (Signed)
Physical Therapy Treatment Patient Details Name: Jeanne Ortiz MRN: [id number] DOB: 02-23-1941 Today's Date: 02/16/2023   History of Present Illness 82 yo female presents to therapy s/p L TKA on 02/15/2023 due to failure of conservative measures. Pt has PMH including but not limited to: asthma, angina with exertion, rhinitis, anemia, CKD, HTN, and  hypothyroidism.    PT Comments    POD # 1 am session AxO x 3 very pleasant Lady who lives home alone but will have a daughter from [place] stay with her initially. Assisted OOB to [facility] then amb a limited distance only this session to pain and effort.   Will see pt again this afternoon with Daughter present for [facility].  Pt plans to D/C to home tomorrow.   Recommendations for follow up therapy are one component of a multi-disciplinary discharge planning process, led by the attending physician.  Recommendations may be updated based on patient status, additional functional criteria and insurance authorization.  Follow Up Recommendations       Assistance Recommended at Discharge Intermittent Supervision/Assistance  Patient can return home with the following A little help with walking and/or transfers;A little help with bathing/dressing/bathroom;Assistance with cooking/housework;Assist for transportation;Help with stairs or ramp for entrance   Equipment Recommendations  Rolling walker (2 wheels)    Recommendations for Other Services       Precautions / Restrictions Precautions Precautions: Knee;Fall Precaution Comments: no pillow under knee Restrictions Weight Bearing Restrictions: No LLE Weight Bearing: Weight bearing as tolerated     Mobility  Bed Mobility Overal bed mobility: Needs Assistance Bed Mobility: Supine to Sit     Supine to sit: Min assist, Min guard     General bed mobility comments: demonstarted and instructed how to use a belt to self assist LE.  Also required increased time.    Transfers Overall transfer  level: Needs assistance Equipment used: Rolling walker (2 wheels) Transfers: Sit to/from Stand Sit to Stand: Min assist           General transfer comment: VC's on proper hand placement and safety with turns.  Also assisted with a BSC transfer.  Pt required asisstance with peri care due to instability.    Ambulation/Gait Ambulation/Gait assistance: Min assist Gait Distance (Feet): 8 Feet Assistive device: Rolling walker (2 wheels) Gait Pattern/deviations: Step-to pattern, Decreased stance time - left, Decreased step length - right, Decreased step length - left Gait velocity: decreased     General Gait Details: VERY limited amb distance of 8 feet due to increased c/o pain and effort needed.  Max c/o fatigue.  Max c/o pain.   Stairs             Wheelchair Mobility    Modified Rankin (Stroke Patients Only)       Balance                                            Cognition Arousal/Alertness: Awake/alert Behavior During Therapy: WFL for tasks assessed/performed Overall Cognitive Status: Within Functional Limits for tasks assessed                                 General Comments: AxO x 3 pleasant.        Exercises      General Comments  Pertinent Vitals/Pain Pain Assessment Pain Assessment: 0-10 Pain Score: 9  Pain Location: L knee Pain Descriptors / Indicators: Aching, Constant, Discomfort, Operative site guarding Pain Intervention(s): Monitored during session, Patient requesting pain meds-RN notified, Ice applied, Repositioned    Home Living                          Prior Function            PT Goals (current goals can now be found in the care plan section) Progress towards PT goals: Progressing toward goals    Frequency    7X/week      PT Plan Current plan remains appropriate    Co-evaluation              AM-PAC PT "6 Clicks" Mobility   Outcome Measure  Help needed turning  from your back to your side while in a flat bed without using bedrails?: A Little Help needed moving from lying on your back to sitting on the side of a flat bed without using bedrails?: A Little Help needed moving to and from a bed to a chair (including a wheelchair)?: A Little Help needed standing up from a chair using your arms (e.g., wheelchair or bedside chair)?: A Little Help needed to walk in hospital room?: A Little Help needed climbing 3-5 steps with a railing? : A Little 6 Click Score: 18    End of Session Equipment Utilized During Treatment: Gait belt Activity Tolerance: Treatment limited secondary to medical complications (Comment) Patient left: in chair;with call bell/phone within reach;with chair alarm set Nurse Communication: Mobility status PT Visit Diagnosis: Unsteadiness on feet (R26.81);Other abnormalities of gait and mobility (R26.89);Muscle weakness (generalized) (M62.81);Pain Pain - Right/Left: Left Pain - part of body: Knee     Time: 8119-1478 PT Time Calculation (min) (ACUTE ONLY): 26 min  Charges:  $Gait Training: 8-22 mins $Therapeutic Activity: 8-22 mins                     Felecia Shelling  PTA Acute  Rehabilitation Services Office M-F          [phone]

## 2023-02-16 NOTE — Progress Notes (Signed)
Physical Therapy Treatment Patient Details Name: Jeanne Ortiz MRN: [id number] DOB: Mar 14, 1941 Today's Date: 02/16/2023   History of Present Illness 82 yo female presents to therapy s/p L TKA on 02/15/2023 due to failure of conservative measures. Pt has PMH including but not limited to: asthma, angina with exertion, rhinitis, anemia, CKD, HTN, and  hypothyroidism.    PT Comments    POD # 1 pm session Daughter Leotis Shames present for [employer]. General transfer comment: increased time and effort dur to pain level and c/o fatigue. General Gait Details: had Daughter "hands on" assist pt with amb to hallway to the stairs and back to bed a limited distance of 22 feet total.General stair comments: VC's on proper sequencing and safety.  Had Daughter "hands on" assist using safety belt.  Pt performed well.  "This is how I did it before surgery" with heavy lean on rails. General bed mobility comments: assisted back to bed per pt request to rest.  Demonstarted and instructed how to use belt to self assist. Then performed some TE's following HEP handout.  Instructed on proper tech, freq as well as use of ICE.   Pt plans to D/C tomorrow.   Recommendations for follow up therapy are one component of a multi-disciplinary discharge planning process, led by the attending physician.  Recommendations may be updated based on patient status, additional functional criteria and insurance authorization.  Follow Up Recommendations       Assistance Recommended at Discharge Intermittent Supervision/Assistance  Patient can return home with the following A little help with walking and/or transfers;A little help with bathing/dressing/bathroom;Assistance with cooking/housework;Assist for transportation;Help with stairs or ramp for entrance   Equipment Recommendations  Rolling walker (2 wheels)    Recommendations for Other Services       Precautions / Restrictions Precautions Precautions: Knee;Fall Precaution  Comments: no pillow under knee Restrictions Weight Bearing Restrictions: No LLE Weight Bearing: Weight bearing as tolerated     Mobility  Bed Mobility Overal bed mobility: Needs Assistance Bed Mobility: Sit to Supine     Supine to sit: Min assist, Min guard     General bed mobility comments: assisted back to bed per pt request to rest.  Demonstarted and instructed how to use belt to self assist.    Transfers Overall transfer level: Needs assistance Equipment used: Rolling walker (2 wheels) Transfers: Sit to/from Stand Sit to Stand: Min assist           General transfer comment: increased time and effort dur to pain level and c/o fatigue.    Ambulation/Gait Ambulation/Gait assistance: Min guard, Min assist Gait Distance (Feet): 22 Feet Assistive device: Rolling walker (2 wheels) Gait Pattern/deviations: Step-to pattern, Decreased stance time - left, Decreased step length - right, Decreased step length - left Gait velocity: decreased     General Gait Details: had Daughter "hands on" assist pt with amb to hallway to the stairs and back to bed a limited distance of 22 feet total.   Stairs Stairs: Yes Stairs assistance: Min guard, Supervision Stair Management: Two rails, Step to pattern, Forwards Number of Stairs: 2 General stair comments: VC's on proper sequencing and safety.  Had Daughter "hands on" assist using safety belt.  Pt performed well.  "This is how I did it before surgery" with heavy lean on rails.   Wheelchair Mobility    Modified Rankin (Stroke Patients Only)       Balance  Cognition Arousal/Alertness: Awake/alert Behavior During Therapy: WFL for tasks assessed/performed Overall Cognitive Status: Within Functional Limits for tasks assessed                                 General Comments: AxO x 3 pleasant.        Exercises  Total Knee Replacement TE's following  HEP handout 10 reps B LE ankle pumps 05 reps towel squeezes 05 reps knee presses 05 reps heel slides  05 reps SAQ's 05 reps SLR's 05 reps ABD Educated on use of gait belt to assist with TE's Followed by ICE     General Comments        Pertinent Vitals/Pain Pain Assessment Pain Assessment: 0-10 Pain Score: 9  Pain Location: L knee Pain Descriptors / Indicators: Aching, Constant, Discomfort, Operative site guarding Pain Intervention(s): Monitored during session, Patient requesting pain meds-RN notified, Ice applied, Repositioned    Home Living                          Prior Function            PT Goals (current goals can now be found in the care plan section) Progress towards PT goals: Progressing toward goals    Frequency    7X/week      PT Plan Current plan remains appropriate    Co-evaluation              AM-PAC PT "6 Clicks" Mobility   Outcome Measure  Help needed turning from your back to your side while in a flat bed without using bedrails?: A Little Help needed moving from lying on your back to sitting on the side of a flat bed without using bedrails?: A Little Help needed moving to and from a bed to a chair (including a wheelchair)?: A Little Help needed standing up from a chair using your arms (e.g., wheelchair or bedside chair)?: A Little Help needed to walk in hospital room?: A Little Help needed climbing 3-5 steps with a railing? : A Little 6 Click Score: 18    End of Session Equipment Utilized During Treatment: Gait belt Activity Tolerance: Treatment limited secondary to medical complications (Comment) Patient left: in bed;with call bell/phone within reach;with bed alarm set;with family/visitor present Nurse Communication: Mobility status PT Visit Diagnosis: Unsteadiness on feet (R26.81);Other abnormalities of gait and mobility (R26.89);Muscle weakness (generalized) (M62.81);Pain Pain - Right/Left: Left Pain - part of body:  Knee     Time: 1344-1415 PT Time Calculation (min) (ACUTE ONLY): 31 min  Charges:  $Gait Training: 8-22 mins $Therapeutic Exercise: 8-22 mins                      Felecia Shelling  PTA Acute  Rehabilitation Services Office M-F          [phone]

## 2023-02-17 DIAGNOSIS — M1712 Unilateral primary osteoarthritis, left knee: Secondary | ICD-10-CM | POA: Diagnosis not present

## 2023-02-17 MED ORDER — ASPIRIN 81 MG PO CHEW: 81.0000 mg | CHEWABLE_TABLET | Freq: Two times a day (BID) | ORAL | 0 refills | Status: DC

## 2023-02-17 MED ORDER — ACETAMINOPHEN 500 MG PO TABS
1000.0000 mg | ORAL_TABLET | Freq: Four times a day (QID) | ORAL | Status: DC | PRN
  Administered 2023-02-17 (×2): 1000 mg via ORAL
  Filled 2023-02-17 (×2): qty 2

## 2023-02-17 MED ORDER — TRAMADOL HCL 50 MG PO TABS: 50.0000 mg | ORAL_TABLET | Freq: Four times a day (QID) | ORAL | 0 refills | Status: DC

## 2023-02-17 NOTE — Progress Notes (Signed)
SPORTS MEDICINE AND JOINT REPLACEMENT  [name], MD    [name], PA-C [address] [place], [place], [place]  [address]                             [phone]   PROGRESS NOTE  Subjective:  negative for Chest Pain  negative for Shortness of Breath  negative for Nausea/Vomiting   negative for Calf Pain  negative for Bowel Movement   Tolerating Diet: yes         Patient reports pain as 5 on 0-10 scale.    Objective: Vital signs in last 24 hours:   Patient Vitals for the past 24 hrs:  BP Temp Temp src Pulse Resp SpO2  02/17/23 0843 -- -- -- -- -- 99 %  02/17/23 0658 (!) 145/76 98.3 F (36.8 C) Oral 72 16 97 %  02/16/23 2343 (!) 157/79 98 F (36.7 C) Oral 77 16 97 %  02/16/23 1931 -- -- -- -- -- 94 %  02/16/23 1432 109/78 98.5 F (36.9 C) Oral 80 18 96 %    @flow {1959:LAST@   Intake/Output from previous day:   06/04 0701 - 06/05 0700 In: 1104.5 [P.O.:720; I.V.:384.5] Out: -    Intake/Output this shift:   06/05 0701 - 06/05 1900 In: 120 [P.O.:120] Out: -    Intake/Output      06/04 0701 06/05 0700 06/05 0701 06/06 0700   P.O. 720 120   I.V. (mL/kg) 384.5 (3.8) 0 (0)   IV Piggyback     Total Intake(mL/kg) 1104.5 (11) 120 (1.2)   Urine (mL/kg/hr)     Blood     Total Output     Net +1104.5 +120        Urine Occurrence 4 x 1 x   Stool Occurrence 0 x 0 x      LABORATORY DATA: Recent Labs    02/10/23 1144  WBC 6.6  HGB 13.0  HCT 39.5  PLT 197   Recent Labs    02/10/23 1144  NA 136  K 4.6  CL 104  CO2 21*  BUN 19  CREATININE 1.06*  GLUCOSE 90  CALCIUM 9.1   No results found for: "INR", "PROTIME"  Examination:  General appearance: alert, cooperative, and no distress Extremities: extremities normal, atraumatic, no cyanosis or edema  Wound Exam: clean, dry, intact   Drainage:  None: wound tissue dry  Motor Exam: Quadriceps and Hamstrings Intact  Sensory Exam: Superficial Peroneal, Deep Peroneal, and Tibial  normal   Assessment:    2 Days Post-Op  Procedure(s) (LRB): TOTAL KNEE ARTHROPLASTY (Left)  ADDITIONAL DIAGNOSIS:  Principal Problem:   S/P total knee replacement     Plan: Physical Therapy as ordered Weight Bearing as Tolerated (WBAT)  DVT Prophylaxis:  Aspirin  DISCHARGE PLAN: Home  Patient doing well, passed PT and ready for D/C home       Patient's anticipated LOS is less than 2 midnights, meeting these requirements: - Lives within 1 hour of care - Has a competent adult at home to recover with post-op recover - NO history of  - Chronic pain requiring opiods  - Diabetes  - Coronary Artery Disease  - Heart failure  - Heart attack  - Stroke  - DVT/VTE  - Cardiac arrhythmia  - Respiratory Failure/COPD  - Renal failure  - Anemia  - Advanced Liver disease      Guy Sandifer 02/17/2023, 11:07 AM

## 2023-02-17 NOTE — Discharge Summary (Signed)
SPORTS MEDICINE & JOINT REPLACEMENT   [name], MD   [name], PA-C [address] [place], [place], [place]  [address]                             [phone]  PATIENT ID: Jeanne Ortiz        MRN:  [id number]          DOB/AGE: 82-25-1942 / 82 y.o.    DISCHARGE SUMMARY  ADMISSION DATE:    02/15/2023 DISCHARGE DATE:   02/17/2023   ADMISSION DIAGNOSIS: S/P total knee replacement [Z96.659]    DISCHARGE DIAGNOSIS:  Osteoarthritis of left knee M17.12    ADDITIONAL DIAGNOSIS: Principal Problem:   S/P total knee replacement  Past Medical History:  Diagnosis Date   Anemia    Anxiety    Arthritis    Asthma    Chronic kidney disease    Depression    GERD (gastroesophageal reflux disease)    Headache    migraines,  seldom since menopause   Hypertension    Hypothyroidism    Pneumonia    PUD (peptic ulcer disease)    Thyroid disease     PROCEDURE: Procedure(s): TOTAL KNEE ARTHROPLASTY on 02/15/2023  CONSULTS:    HISTORY:  See H&P in chart  HOSPITAL COURSE:  Jeanne Ortiz is a 82 y.o. admitted on 02/15/2023 and found to have a diagnosis of Osteoarthritis of left knee M17.12.  After appropriate laboratory studies were obtained  they were taken to the operating room on 02/15/2023 and underwent Procedure(s): TOTAL KNEE ARTHROPLASTY.   They were given perioperative antibiotics:  Anti-infectives (From admission, onward)    Start     Dose/Rate Route Frequency Ordered Stop   02/15/23 0730  ceFAZolin (ANCEF) IVPB 2g/100 mL premix        2 g 200 mL/hr over 30 Minutes Intravenous On call to O.R. 02/15/23 2130 02/15/23 [id number]     .  Patient given tranexamic acid IV or topical and exparel intra-operatively.  Tolerated the procedure well.    POD# 1: Vital signs were stable.  Patient denied Chest pain, shortness of breath, or calf pain.  Patient was started on Aspirin twice daily at 8am.  Consults to PT, OT, and care management were made.  The patient was weight bearing as  tolerated.  CPM was placed on the operative leg 0-90 degrees for 6-8 hours a day. When out of the CPM, patient was placed in the foam block to achieve full extension. Incentive spirometry was taught.  Dressing was changed.       POD #2, Continued  PT for ambulation and exercise program.  IV saline locked.  O2 discontinued.    The remainder of the hospital course was dedicated to ambulation and strengthening.   The patient was discharged on 2 Days Post-Op in  Good condition.  Blood products given:none  DIAGNOSTIC STUDIES: Recent vital signs: Patient Vitals for the past 24 hrs:  BP Temp Temp src Pulse Resp SpO2  02/17/23 0843 -- -- -- -- -- 99 %  02/17/23 0658 (!) 145/76 98.3 F (36.8 C) Oral 72 16 97 %  02/16/23 2343 (!) 157/79 98 F (36.7 C) Oral 77 16 97 %  02/16/23 1931 -- -- -- -- -- 94 %  02/16/23 1432 109/78 98.5 F (36.9 C) Oral 80 18 96 %       Recent laboratory studies: Recent Labs    02/10/23 1144  WBC 6.6  HGB 13.0  HCT 39.5  PLT 197   Recent Labs    02/10/23 1144  NA 136  K 4.6  CL 104  CO2 21*  BUN 19  CREATININE 1.06*  GLUCOSE 90  CALCIUM 9.1   No results found for: "INR", "PROTIME"   Recent Radiographic Studies :  No results found.  DISCHARGE INSTRUCTIONS: Discharge Instructions     Call MD / Call 911   Complete by: As directed    If you experience chest pain or shortness of breath, CALL 911 and be transported to the hospital emergency room.  If you develope a fever above 101 F, pus (white drainage) or increased drainage or redness at the wound, or calf pain, call your surgeon's office.   Constipation Prevention   Complete by: As directed    Drink plenty of fluids.  Prune juice may be helpful.  You may use a stool softener, such as Colace (over the counter) 100 mg twice a day.  Use MiraLax (over the counter) for constipation as needed.   Diet - low sodium heart healthy   Complete by: As directed    Discharge instructions   Complete by: As  directed    INSTRUCTIONS AFTER JOINT REPLACEMENT   Remove items at home which could result in a fall. This includes throw rugs or furniture in walking pathways ICE to the affected joint every three hours while awake for 30 minutes at a time, for at least the first 3-5 days, and then as needed for pain and swelling.  Continue to use ice for pain and swelling. You may notice swelling that will progress down to the foot and ankle.  This is normal after surgery.  Elevate your leg when you are not up walking on it.   Continue to use the breathing machine you got in the hospital (incentive spirometer) which will help keep your temperature down.  It is common for your temperature to cycle up and down following surgery, especially at night when you are not up moving around and exerting yourself.  The breathing machine keeps your lungs expanded and your temperature down.   DIET:  As you were doing prior to hospitalization, we recommend a well-balanced diet.  DRESSING / WOUND CARE / SHOWERING  Keep the surgical dressing until follow up.  The dressing is water proof, so you can shower without any extra covering.  IF THE DRESSING FALLS OFF or the wound gets wet inside, change the dressing with sterile gauze.  Please use good hand washing techniques before changing the dressing.  Do not use any lotions or creams on the incision until instructed by your surgeon.    ACTIVITY  Increase activity slowly as tolerated, but follow the weight bearing instructions below.   No driving for 6 weeks or until further direction given by your physician.  You cannot drive while taking narcotics.  No lifting or carrying greater than 10 lbs. until further directed by your surgeon. Avoid periods of inactivity such as sitting longer than an hour when not asleep. This helps prevent blood clots.  You may return to work once you are authorized by your doctor.     WEIGHT BEARING   Weight bearing as tolerated with assist device  (walker, cane, etc) as directed, use it as long as suggested by your surgeon or therapist, typically at least 4-6 weeks.   EXERCISES  Results after joint replacement surgery are often greatly improved when you follow the exercise, range  of motion and muscle strengthening exercises prescribed by your doctor. Safety measures are also important to protect the joint from further injury. Any time any of these exercises cause you to have increased pain or swelling, decrease what you are doing until you are comfortable again and then slowly increase them. If you have problems or questions, call your caregiver or physical therapist for advice.   Rehabilitation is important following a joint replacement. After just a few days of immobilization, the muscles of the leg can become weakened and shrink (atrophy).  These exercises are designed to build up the tone and strength of the thigh and leg muscles and to improve motion. Often times heat used for twenty to thirty minutes before working out will loosen up your tissues and help with improving the range of motion but do not use heat for the first two weeks following surgery (sometimes heat can increase post-operative swelling).   These exercises can be done on a training (exercise) mat, on the floor, on a table or on a bed. Use whatever works the best and is most comfortable for you.    Use music or television while you are exercising so that the exercises are a pleasant break in your day. This will make your life better with the exercises acting as a break in your routine that you can look forward to.   Perform all exercises about fifteen times, three times per day or as directed.  You should exercise both the operative leg and the other leg as well.  Exercises include:   Quad Sets - Tighten up the muscle on the front of the thigh (Quad) and hold for 5-10 seconds.   Straight Leg Raises - With your knee straight (if you were given a brace, keep it on), lift the  leg to 60 degrees, hold for 3 seconds, and slowly lower the leg.  Perform this exercise against resistance later as your leg gets stronger.  Leg Slides: Lying on your back, slowly slide your foot toward your buttocks, bending your knee up off the floor (only go as far as is comfortable). Then slowly slide your foot back down until your leg is flat on the floor again.  Angel Wings: Lying on your back spread your legs to the side as far apart as you can without causing discomfort.  Hamstring Strength:  Lying on your back, push your heel against the floor with your leg straight by tightening up the muscles of your buttocks.  Repeat, but this time bend your knee to a comfortable angle, and push your heel against the floor.  You may put a pillow under the heel to make it more comfortable if necessary.   A rehabilitation program following joint replacement surgery can speed recovery and prevent re-injury in the future due to weakened muscles. Contact your doctor or a physical therapist for more information on knee rehabilitation.    CONSTIPATION  Constipation is defined medically as fewer than three stools per week and severe constipation as less than one stool per week.  Even if you have a regular bowel pattern at home, your normal regimen is likely to be disrupted due to multiple reasons following surgery.  Combination of anesthesia, postoperative narcotics, change in appetite and fluid intake all can affect your bowels.   YOU MUST use at least one of the following options; they are listed in order of increasing strength to get the job done.  They are all available over the counter, and you may  need to use some, POSSIBLY even all of these options:    Drink plenty of fluids (prune juice may be helpful) and high fiber foods Colace 100 mg by mouth twice a day  Senokot for constipation as directed and as needed Dulcolax (bisacodyl), take with full glass of water  Miralax (polyethylene glycol) once or twice  a day as needed.  If you have tried all these things and are unable to have a bowel movement in the first 3-4 days after surgery call either your surgeon or your primary doctor.    If you experience loose stools or diarrhea, hold the medications until you stool forms back up.  If your symptoms do not get better within 1 week or if they get worse, check with your doctor.  If you experience "the worst abdominal pain ever" or develop nausea or vomiting, please contact the office immediately for further recommendations for treatment.   ITCHING:  If you experience itching with your medications, try taking only a single pain pill, or even half a pain pill at a time.  You can also use Benadryl over the counter for itching or also to help with sleep.   TED HOSE STOCKINGS:  Use stockings on both legs until for at least 2 weeks or as directed by physician office. They may be removed at night for sleeping.  MEDICATIONS:  See your medication summary on the "After Visit Summary" that nursing will review with you.  You may have some home medications which will be placed on hold until you complete the course of blood thinner medication.  It is important for you to complete the blood thinner medication as prescribed.  PRECAUTIONS:  If you experience chest pain or shortness of breath - call 911 immediately for transfer to the hospital emergency department.   If you develop a fever greater that 101 F, purulent drainage from wound, increased redness or drainage from wound, foul odor from the wound/dressing, or calf pain - CONTACT YOUR SURGEON.                                                   FOLLOW-UP APPOINTMENTS:  If you do not already have a post-op appointment, please call the office for an appointment to be seen by your surgeon.  Guidelines for how soon to be seen are listed in your "After Visit Summary", but are typically between 1-4 weeks after surgery.  OTHER INSTRUCTIONS:   Knee Replacement:  Do not  place pillow under knee, focus on keeping the knee straight while resting. CPM instructions: 0-90 degrees, 2 hours in the morning, 2 hours in the afternoon, and 2 hours in the evening. Place foam block, curve side up under heel at all times except when in CPM or when walking.  DO NOT modify, tear, cut, or change the foam block in any way.  POST-OPERATIVE OPIOID TAPER INSTRUCTIONS: It is important to wean off of your opioid medication as soon as possible. If you do not need pain medication after your surgery it is ok to stop day one. Opioids include: Codeine, Hydrocodone(Norco, Vicodin), Oxycodone(Percocet, oxycontin) and hydromorphone amongst others.  Long term and even short term use of opiods can cause: Increased pain response Dependence Constipation Depression Respiratory depression And more.  Withdrawal symptoms can include Flu like symptoms Nausea, vomiting And more Techniques to manage these  symptoms Hydrate well Eat regular healthy meals Stay active Use relaxation techniques(deep breathing, meditating, yoga) Do Not substitute Alcohol to help with tapering If you have been on opioids for less than two weeks and do not have pain than it is ok to stop all together.  Plan to wean off of opioids This plan should start within one week post op of your joint replacement. Maintain the same interval or time between taking each dose and first decrease the dose.  Cut the total daily intake of opioids by one tablet each day Next start to increase the time between doses. The last dose that should be eliminated is the evening dose.     MAKE SURE YOU:  Understand these instructions.  Get help right away if you are not doing well or get worse.    Thank you for letting us be a part of your medical care team.  It is a privilege we respect greatly.  We hope these instructions will help you stay on track for a fast and full recovery!   Increase activity slowly as tolerated   Complete by: As  directed    Post-operative opioid taper instructions:   Complete by: As directed    POST-OPERATIVE OPIOID TAPER INSTRUCTIONS: It is important to wean off of your opioid medication as soon as possible. If you do not need pain medication after your surgery it is ok to stop day one. Opioids include: Codeine, Hydrocodone(Norco, Vicodin), Oxycodone(Percocet, oxycontin) and hydromorphone amongst others.  Long term and even short term use of opiods can cause: Increased pain response Dependence Constipation Depression Respiratory depression And more.  Withdrawal symptoms can include Flu like symptoms Nausea, vomiting And more Techniques to manage these symptoms Hydrate well Eat regular healthy meals Stay active Use relaxation techniques(deep breathing, meditating, yoga) Do Not substitute Alcohol to help with tapering If you have been on opioids for less than two weeks and do not have pain than it is ok to stop all together.  Plan to wean off of opioids This plan should start within one week post op of your joint replacement. Maintain the same interval or time between taking each dose and first decrease the dose.  Cut the total daily intake of opioids by one tablet each day Next start to increase the time between doses. The last dose that should be eliminated is the evening dose.          DISCHARGE MEDICATIONS:   Allergies as of 02/17/2023       Reactions   Fentanyl And Related Nausea And Vomiting   Severe nausea and vomiting and severe migraine headache.   Morphine And Codeine Nausea And Vomiting   ANY NARCOTIC causes severe nausea and vomiting and severe migraine headache.   Other    brazile and macadamia nuts - migraines   Propofol Nausea And Vomiting   Sulfa Antibiotics Hives, Swelling        Medication List     STOP taking these medications    ibuprofen 200 MG tablet Commonly known as: ADVIL   naproxen sodium 220 MG tablet Commonly known as: ALEVE        TAKE these medications    acetaminophen 500 MG tablet Commonly known as: TYLENOL Take 1,000 mg by mouth every 6 (six) hours as needed for moderate pain.   albuterol 108 (90 Base) MCG/ACT inhaler Commonly known as: ProAir HFA Inhale 2 puffs into the lungs every 4 (four) hours as needed.   aluminum-magnesium hydroxide-simethicone 200-200-20 MG/5ML  Susp Commonly known as: MAALOX Take 30 mLs by mouth daily as needed (indigestion).   aspirin 81 MG chewable tablet Chew 1 tablet (81 mg total) by mouth 2 (two) times daily.   budesonide-formoterol 160-4.5 MCG/ACT inhaler Commonly known as: Symbicort TAKE 2 PUFFS FIRST THING IN AM AND THEN ANOTHER 2 PUFFS ABOUT 12 HOURS LATER.   buPROPion 150 MG 24 hr tablet Commonly known as: WELLBUTRIN XL Take 150 mg by mouth daily.   cetirizine-pseudoephedrine 5-120 MG tablet Commonly known as: ZYRTEC-D Take 1 tablet by mouth 2 (two) times daily as needed for allergies.   Co Q-10 300 MG Caps Take 300 mg by mouth daily.   DHEA 50 50 MG Caps Generic drug: Prasterone (DHEA) Take 50 mg by mouth daily.   famotidine 20 MG tablet Commonly known as: Pepcid One after bfast and one  after supper What changed:  how much to take how to take this when to take this reasons to take this additional instructions   levothyroxine 112 MCG tablet Commonly known as: SYNTHROID Take 112 mcg by mouth daily before breakfast.   LORazepam 0.5 MG tablet Commonly known as: ATIVAN Take 0.5 mg by mouth at bedtime.   losartan 50 MG tablet Commonly known as: COZAAR Take 50 mg by mouth daily.   Lutein-Zeaxanthin Tabs Take 1 tablet by mouth daily.   Magnesium 200 MG Tabs Take 200 mg by mouth daily.   metoprolol tartrate 25 MG tablet Commonly known as: LOPRESSOR TAKE 1 TABLET BY MOUTH TWICE A DAY What changed:  when to take this reasons to take this   Mucinex DM Maximum Strength 60-1200 MG Tb12 Take 1 tablet by mouth 2 (two) times daily as needed  (congestion).   Olopatadine HCl 0.2 % Soln Apply 1 drop to eye daily as needed (for itchy eyes).   Omega-3 300 MG Caps Take 300 mg by mouth daily.   ondansetron 4 MG tablet Commonly known as: ZOFRAN Take 1 tablet (4 mg total) by mouth every 6 (six) hours.   oxymetazoline 0.05 % nasal spray Commonly known as: AFRIN Place 1 spray into both nostrils daily as needed for congestion.   Ozempic (0.25 or 0.5 MG/DOSE) 2 MG/1.5ML Sopn Generic drug: Semaglutide(0.25 or 0.5MG /DOS) Inject 0.25 mg into the skin every Friday.   spironolactone 50 MG tablet Commonly known as: ALDACTONE Take 50 mg by mouth daily.   SUPER B COMPLEX PO Take 1 capsule by mouth daily.   traMADol 50 MG tablet Commonly known as: ULTRAM Take 1-2 tablets (50-100 mg total) by mouth every 6 (six) hours.   venlafaxine 25 MG tablet Commonly known as: EFFEXOR Take 25 mg by mouth daily.   Vitamin D 50 MCG (2000 UT) tablet Take 4,000 Units by mouth daily.   vitamin E 180 MG (400 UNITS) capsule Take 400 Units by mouth daily.   zinc gluconate 50 MG tablet Take 50 mg by mouth daily as needed (immune support).               Durable Medical Equipment  (From admission, onward)           Start     Ordered   02/15/23 1340  DME Walker rolling  Once       Question:  Patient needs a walker to treat with the following condition  Answer:  S/P total knee replacement   02/15/23 1339   02/15/23 1340  DME 3 n 1  Once        02/15/23  1339   02/15/23 1340  DME Bedside commode  Once       Question:  Patient needs a bedside commode to treat with the following condition  Answer:  S/P total knee replacement   02/15/23 1339            FOLLOW UP VISIT:    DISPOSITION: HOME VS. SNF  Dental Antibiotics:  In most cases prophylactic antibiotics for Dental procdeures after total joint surgery are not necessary.  Exceptions are as follows:  1. History of prior total joint infection  2. Severely  immunocompromised (Organ Transplant, cancer chemotherapy, Rheumatoid biologic meds such as Humera)  3. Poorly controlled diabetes (A1C &gt; 8.0, blood glucose over 200)  If you have one of these conditions, contact your surgeon for an antibiotic prescription, prior to your dental procedure.   CONDITION:  Good   Guy Sandifer 02/17/2023, 11:17 AM

## 2023-02-17 NOTE — Progress Notes (Signed)
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 Pt has PMH including but not limited to: asthma, angina with exertion, rhinitis, anemia, CKD, HTN, and  hypothyroidism.    PT Comments    POD # 2 am session Pt feeling better.  Tolerated an increased distance in hallway.  Navigated stairs.  Then returned to room to perform some TE's following HEP handout.  Instructed on proper tech, freq as well as use of ICE.   Addressed all mobility questions, discussed appropriate activity, educated on use of ICE.  Pt ready for D/C to home.    Recommendations for follow up therapy are one component of a multi-disciplinary discharge planning process, led by the attending physician.  Recommendations may be updated based on patient status, additional functional criteria and insurance authorization.  Follow Up Recommendations       Assistance Recommended at Discharge Intermittent Supervision/Assistance  Patient can return home with the following A little help with walking and/or transfers;A little help with bathing/dressing/bathroom;Assistance with cooking/housework;Assist for transportation;Help with stairs or ramp for entrance   Equipment Recommendations  Rolling walker (2 wheels)    Recommendations for Other Services       Precautions / Restrictions Precautions Precautions: Knee;Fall Precaution Comments: no pillow under knee Restrictions Weight Bearing Restrictions: No LLE Weight Bearing: Weight bearing as tolerated     Mobility  Bed Mobility Overal bed mobility: Needs Assistance Bed Mobility: Supine to Sit, Sit to Supine     Supine to sit: Supervision Sit to supine: Supervision   General bed mobility comments: self able with increased time    Transfers Overall transfer level: Needs  assistance Equipment used: Rolling walker (2 wheels) Transfers: Sit to/from Stand Sit to Stand: Supervision           General transfer comment: increased time and effort dur to pain level and c/o fatigue.    Ambulation/Gait Ambulation/Gait assistance: Supervision, Min guard Gait Distance (Feet): 55 Feet Assistive device: Rolling walker (2 wheels) Gait Pattern/deviations: Step-to pattern, Decreased stance time - left, Decreased step length - right, Decreased step length - left Gait velocity: decreased     General Gait Details: pt tolerated an increased distance   Stairs   Stairs assistance: Supervision Stair Management: Two rails, Step to pattern, Forwards Number of Stairs: 2 General stair comments: pt did well navigating stairs   Wheelchair Mobility    Modified Rankin (Stroke Patients Only)       Balance                                            Cognition Arousal/Alertness: Awake/alert Behavior During Therapy: WFL for tasks assessed/performed Overall Cognitive Status: Within Functional Limits for tasks assessed                                 General Comments: AxO x 3 pleasant.        Exercises  Total Knee Replacement TE's following HEP handout 10 reps B LE ankle pumps 05 reps towel squeezes 05 reps knee presses 05 reps heel slides  05 reps SAQ's 05 reps SLR's 05 reps ABD Educated on use of gait belt  to assist with TE's Followed by ICE     General Comments        Pertinent Vitals/Pain Pain Assessment Pain Assessment: 0-10 Pain Score: 5  Pain Location: L knee Pain Descriptors / Indicators: Aching, Constant, Discomfort, Operative site guarding Pain Intervention(s): Monitored during session, Premedicated before session, Repositioned, Ice applied    Home Living                          Prior Function            PT Goals (current goals can now be found in the care plan section) Progress towards  PT goals: Progressing toward goals    Frequency    7X/week      PT Plan Current plan remains appropriate    Co-evaluation              AM-PAC PT "6 Clicks" Mobility   Outcome Measure  Help needed turning from your back to your side while in a flat bed without using bedrails?: A Little Help needed moving from lying on your back to sitting on the side of a flat bed without using bedrails?: A Little Help needed moving to and from a bed to a chair (including a wheelchair)?: A Little Help needed standing up from a chair using your arms (e.g., wheelchair or bedside chair)?: A Little Help needed to walk in hospital room?: A Little Help needed climbing 3-5 steps with a railing? : A Little 6 Click Score: 18    End of Session Equipment Utilized During Treatment: Gait belt Activity Tolerance: Treatment limited secondary to medical complications (Comment) Patient left: in bed;with call bell/phone within reach;with bed alarm set;with family/visitor present Nurse Communication: Mobility status PT Visit Diagnosis: Unsteadiness on feet (R26.81);Other abnormalities of gait and mobility (R26.89);Muscle weakness (generalized) (M62.81);Pain Pain - Right/Left: Left Pain - part of body: Knee     Time: 1035-1100 PT Time Calculation (min) (ACUTE ONLY): 25 min  Charges:  $Gait Training: 8-22 mins $Therapeutic Activity: 8-22 mins                     Felecia Shelling  PTA Acute  Rehabilitation Services Office M-F          [phone]

## 2023-02-17 NOTE — Plan of Care (Signed)
  Problem: Education: Goal: Knowledge of the prescribed therapeutic regimen will improve Outcome: Progressing   Problem: Pain Management: Goal: Pain level will decrease with appropriate interventions Outcome: Progressing   

## 2023-02-26 ENCOUNTER — Telehealth: Payer: Self-pay | Admitting: Internal Medicine

## 2023-02-26 MED ORDER — DOXYCYCLINE HYCLATE 100 MG PO TABS: 100.0000 mg | ORAL_TABLET | Freq: Two times a day (BID) | ORAL | 0 refills | Status: DC

## 2023-02-26 NOTE — Telephone Encounter (Signed)
Called and spoke with patient. She stated that she had knee replacement surgery on 02/15/23. Prior to the surgery, she had a minor sinus infection but the surgeon advised her that she could still have the surgery. At the time she had a productive cough with clear phlegm.   Her cough increased yesterday and she is now coughing up grayish-greenish phlegm. She has also noticed that her chest is more tight. She is using her albuterol at 5 times a day to help with the wheezing and coughing. Her temp normally runs around 97.42F but she checked her temp this morning and it was at 31F.   She confirmed that she is still using her Symbicort 160 twice daily.   She wanted to know if she have something sent in for her. It is still really hard for her to get around after having surgery.   Pharmacy is [facility] on [address].   RB, can you please advise MW is not available? Thanks!

## 2023-02-26 NOTE — Telephone Encounter (Signed)
Called patient but she did not answer. Left message for patient to call back.  

## 2023-02-26 NOTE — Telephone Encounter (Signed)
Please call in doxycycline 100mg bid x 7 days.  

## 2023-02-26 NOTE — Telephone Encounter (Signed)
Called and spoke with patient. She verbalized understanding. RX has been sent. Nothing further needed at time of call.  

## 2023-02-26 NOTE — Telephone Encounter (Signed)
PT calling again. Sounds very bad. Wondering status of RX. Will adv waiting on Dr. Also tried to get Triage to no avail. I will recommend she  present to the ER.

## 2023-02-26 NOTE — Telephone Encounter (Signed)
PT calling saying she had surgery Jun 3rd (Knee replacement) Today she is experiencing coughing and her chest is tight. Should she got to ER ? Please call @ [phone]  Pharm is [facility] on [address]

## 2023-08-24 ENCOUNTER — Telehealth: Payer: Self-pay | Admitting: Internal Medicine

## 2023-08-24 NOTE — Telephone Encounter (Signed)
Pt calling in for a refill for her inhaler budesonide-formoterol (SYMBICORT) 160-4.5 MCG/ACT inhaler

## 2023-08-25 MED ORDER — BUDESONIDE-FORMOTEROL FUMARATE 160-4.5 MCG/ACT IN AERO: INHALATION_SPRAY | RESPIRATORY_TRACT | 11 refills | Status: DC

## 2023-08-25 NOTE — Telephone Encounter (Signed)
Refill has been sent to pharmacy [facility]

## 2023-08-25 NOTE — Telephone Encounter (Signed)
Pt calling upset inhaler has not been filled. States she is running out.   Pharm: [facility] on [address].

## 2023-08-31 ENCOUNTER — Other Ambulatory Visit: Payer: Self-pay | Admitting: Family Medicine

## 2023-08-31 DIAGNOSIS — Z1231 Encounter for screening mammogram for malignant neoplasm of breast: Secondary | ICD-10-CM

## 2023-09-03 ENCOUNTER — Ambulatory Visit
Admission: RE | Admit: 2023-09-03 | Discharge: 2023-09-03 | Disposition: A | Discharge status: Home / Self Care | Payer: Medicare HMO | Source: Ambulatory Visit | Attending: Family Medicine | Admitting: Family Medicine

## 2023-09-03 DIAGNOSIS — Z1231 Encounter for screening mammogram for malignant neoplasm of breast: Secondary | ICD-10-CM

## 2023-09-07 ENCOUNTER — Ambulatory Visit: Payer: Medicare HMO | Admitting: Internal Medicine

## 2023-10-16 ENCOUNTER — Inpatient Hospital Stay (HOSPITAL_BASED_OUTPATIENT_CLINIC_OR_DEPARTMENT_OTHER)
Admission: EM | Admit: 2023-10-16 | Discharge: 2023-10-21 | DRG: 194 | Disposition: A | Discharge status: Home / Self Care | Payer: Medicare HMO | Attending: Internal Medicine | Admitting: Internal Medicine

## 2023-10-16 ENCOUNTER — Encounter (HOSPITAL_BASED_OUTPATIENT_CLINIC_OR_DEPARTMENT_OTHER): Payer: Self-pay

## 2023-10-16 ENCOUNTER — Other Ambulatory Visit: Payer: Self-pay

## 2023-10-16 ENCOUNTER — Emergency Department (HOSPITAL_BASED_OUTPATIENT_CLINIC_OR_DEPARTMENT_OTHER): Payer: Medicare HMO | Admitting: Radiology

## 2023-10-16 DIAGNOSIS — F32A Depression, unspecified: Secondary | ICD-10-CM | POA: Diagnosis not present

## 2023-10-16 DIAGNOSIS — E039 Hypothyroidism, unspecified: Secondary | ICD-10-CM | POA: Diagnosis present

## 2023-10-16 DIAGNOSIS — Z882 Allergy status to sulfonamides status: Secondary | ICD-10-CM

## 2023-10-16 DIAGNOSIS — R0902 Hypoxemia: Secondary | ICD-10-CM | POA: Diagnosis not present

## 2023-10-16 DIAGNOSIS — J45901 Unspecified asthma with (acute) exacerbation: Secondary | ICD-10-CM | POA: Diagnosis not present

## 2023-10-16 DIAGNOSIS — J111 Influenza due to unidentified influenza virus with other respiratory manifestations: Secondary | ICD-10-CM | POA: Diagnosis present

## 2023-10-16 DIAGNOSIS — Z833 Family history of diabetes mellitus: Secondary | ICD-10-CM | POA: Diagnosis not present

## 2023-10-16 DIAGNOSIS — J4521 Mild intermittent asthma with (acute) exacerbation: Secondary | ICD-10-CM | POA: Diagnosis not present

## 2023-10-16 DIAGNOSIS — I129 Hypertensive chronic kidney disease with stage 1 through stage 4 chronic kidney disease, or unspecified chronic kidney disease: Secondary | ICD-10-CM | POA: Diagnosis not present

## 2023-10-16 DIAGNOSIS — G4733 Obstructive sleep apnea (adult) (pediatric): Secondary | ICD-10-CM | POA: Diagnosis present

## 2023-10-16 DIAGNOSIS — Z7982 Long term (current) use of aspirin: Secondary | ICD-10-CM

## 2023-10-16 DIAGNOSIS — Z885 Allergy status to narcotic agent status: Secondary | ICD-10-CM

## 2023-10-16 DIAGNOSIS — Z90710 Acquired absence of both cervix and uterus: Secondary | ICD-10-CM

## 2023-10-16 DIAGNOSIS — N1831 Chronic kidney disease, stage 3a: Secondary | ICD-10-CM | POA: Diagnosis not present

## 2023-10-16 DIAGNOSIS — Z823 Family history of stroke: Secondary | ICD-10-CM

## 2023-10-16 DIAGNOSIS — Z8711 Personal history of peptic ulcer disease: Secondary | ICD-10-CM | POA: Diagnosis not present

## 2023-10-16 DIAGNOSIS — E871 Hypo-osmolality and hyponatremia: Secondary | ICD-10-CM | POA: Diagnosis present

## 2023-10-16 DIAGNOSIS — Z79890 Hormone replacement therapy (postmenopausal): Secondary | ICD-10-CM

## 2023-10-16 DIAGNOSIS — J09X1 Influenza due to identified novel influenza A virus with pneumonia: Secondary | ICD-10-CM | POA: Diagnosis not present

## 2023-10-16 DIAGNOSIS — Z1152 Encounter for screening for COVID-19: Secondary | ICD-10-CM | POA: Diagnosis not present

## 2023-10-16 DIAGNOSIS — E785 Hyperlipidemia, unspecified: Secondary | ICD-10-CM | POA: Diagnosis not present

## 2023-10-16 DIAGNOSIS — Z6841 Body Mass Index (BMI) 40.0 and over, adult: Secondary | ICD-10-CM | POA: Diagnosis not present

## 2023-10-16 DIAGNOSIS — R059 Cough, unspecified: Secondary | ICD-10-CM | POA: Diagnosis present

## 2023-10-16 DIAGNOSIS — Z96652 Presence of left artificial knee joint: Secondary | ICD-10-CM | POA: Diagnosis not present

## 2023-10-16 DIAGNOSIS — Z79899 Other long term (current) drug therapy: Secondary | ICD-10-CM

## 2023-10-16 DIAGNOSIS — N179 Acute kidney failure, unspecified: Secondary | ICD-10-CM | POA: Diagnosis not present

## 2023-10-16 DIAGNOSIS — M1990 Unspecified osteoarthritis, unspecified site: Secondary | ICD-10-CM | POA: Diagnosis present

## 2023-10-16 DIAGNOSIS — Z7951 Long term (current) use of inhaled steroids: Secondary | ICD-10-CM

## 2023-10-16 DIAGNOSIS — Z7985 Long-term (current) use of injectable non-insulin antidiabetic drugs: Secondary | ICD-10-CM

## 2023-10-16 DIAGNOSIS — I10 Essential (primary) hypertension: Secondary | ICD-10-CM | POA: Diagnosis present

## 2023-10-16 DIAGNOSIS — K219 Gastro-esophageal reflux disease without esophagitis: Secondary | ICD-10-CM | POA: Diagnosis present

## 2023-10-16 DIAGNOSIS — F39 Unspecified mood [affective] disorder: Secondary | ICD-10-CM | POA: Diagnosis present

## 2023-10-16 DIAGNOSIS — E860 Dehydration: Secondary | ICD-10-CM | POA: Diagnosis present

## 2023-10-16 DIAGNOSIS — F419 Anxiety disorder, unspecified: Secondary | ICD-10-CM | POA: Diagnosis present

## 2023-10-16 DIAGNOSIS — Z888 Allergy status to other drugs, medicaments and biological substances status: Secondary | ICD-10-CM

## 2023-10-16 LAB — CBC WITH DIFFERENTIAL/PLATELET
Abs Immature Granulocytes: 0.08 10*3/uL — ABNORMAL HIGH (ref 0.00–0.07)
Basophils Absolute: 0 10*3/uL (ref 0.0–0.1)
Basophils Relative: 0 %
Eosinophils Absolute: 0.1 10*3/uL (ref 0.0–0.5)
Eosinophils Relative: 1 %
HCT: 37.3 % (ref 36.0–46.0)
Hemoglobin: 12.7 g/dL (ref 12.0–15.0)
Immature Granulocytes: 1 %
Lymphocytes Relative: 3 %
Lymphs Abs: 0.3 10*3/uL — ABNORMAL LOW (ref 0.7–4.0)
MCH: 29.6 pg (ref 26.0–34.0)
MCHC: 34 g/dL (ref 30.0–36.0)
MCV: 86.9 fL (ref 80.0–100.0)
Monocytes Absolute: 0.3 10*3/uL (ref 0.1–1.0)
Monocytes Relative: 3 %
Neutro Abs: 11.2 10*3/uL — ABNORMAL HIGH (ref 1.7–7.7)
Neutrophils Relative %: 92 %
Platelets: 126 10*3/uL — ABNORMAL LOW (ref 150–400)
RBC: 4.29 MIL/uL (ref 3.87–5.11)
RDW: 13.4 % (ref 11.5–15.5)
WBC: 12 10*3/uL — ABNORMAL HIGH (ref 4.0–10.5)
nRBC: 0 % (ref 0.0–0.2)

## 2023-10-16 LAB — RESP PANEL BY RT-PCR (RSV, FLU A&B, COVID)  RVPGX2
Influenza A by PCR: POSITIVE — AB
Influenza B by PCR: NEGATIVE
Resp Syncytial Virus by PCR: NEGATIVE
SARS Coronavirus 2 by RT PCR: NEGATIVE

## 2023-10-16 LAB — BLOOD GAS, VENOUS
Acid-base deficit: 4.8 mmol/L — ABNORMAL HIGH (ref 0.0–2.0)
Bicarbonate: 21.1 mmol/L (ref 20.0–28.0)
O2 Saturation: 80.8 %
Patient temperature: 37
pCO2, Ven: 41 mm[Hg] — ABNORMAL LOW (ref 44–60)
pH, Ven: 7.32 (ref 7.25–7.43)
pO2, Ven: 45 mm[Hg] (ref 32–45)

## 2023-10-16 LAB — MAGNESIUM: Magnesium: 2.3 mg/dL (ref 1.7–2.4)

## 2023-10-16 LAB — COMPREHENSIVE METABOLIC PANEL
ALT: 16 U/L (ref 0–44)
AST: 26 U/L (ref 15–41)
Albumin: 2.9 g/dL — ABNORMAL LOW (ref 3.5–5.0)
Alkaline Phosphatase: 38 U/L (ref 38–126)
Anion gap: 12 (ref 5–15)
BUN: 31 mg/dL — ABNORMAL HIGH (ref 8–23)
CO2: 18 mmol/L — ABNORMAL LOW (ref 22–32)
Calcium: 8 mg/dL — ABNORMAL LOW (ref 8.9–10.3)
Chloride: 101 mmol/L (ref 98–111)
Creatinine, Ser: 1.47 mg/dL — ABNORMAL HIGH (ref 0.44–1.00)
GFR, Estimated: 35 mL/min — ABNORMAL LOW (ref >60)
Glucose, Bld: 155 mg/dL — ABNORMAL HIGH (ref 70–99)
Potassium: 3.8 mmol/L (ref 3.5–5.1)
Sodium: 131 mmol/L — ABNORMAL LOW (ref 135–145)
Total Bilirubin: 0.6 mg/dL (ref 0.0–1.2)
Total Protein: 5.7 g/dL — ABNORMAL LOW (ref 6.5–8.1)

## 2023-10-16 LAB — BASIC METABOLIC PANEL
Anion gap: 10 (ref 5–15)
BUN: 33 mg/dL — ABNORMAL HIGH (ref 8–23)
CO2: 21 mmol/L — ABNORMAL LOW (ref 22–32)
Calcium: 8.4 mg/dL — ABNORMAL LOW (ref 8.9–10.3)
Chloride: 98 mmol/L (ref 98–111)
Creatinine, Ser: 1.68 mg/dL — ABNORMAL HIGH (ref 0.44–1.00)
GFR, Estimated: 30 mL/min — ABNORMAL LOW (ref >60)
Glucose, Bld: 117 mg/dL — ABNORMAL HIGH (ref 70–99)
Potassium: 3.9 mmol/L (ref 3.5–5.1)
Sodium: 129 mmol/L — ABNORMAL LOW (ref 135–145)

## 2023-10-16 LAB — LACTIC ACID, PLASMA
Lactic Acid, Venous: 1 mmol/L (ref 0.5–1.9)
Lactic Acid, Venous: 0.6 mmol/L (ref 0.5–1.9)

## 2023-10-16 LAB — OSMOLALITY: Osmolality: 289 mosm/kg (ref 275–295)

## 2023-10-16 LAB — CK: Total CK: 207 U/L (ref 38–234)

## 2023-10-16 LAB — PHOSPHORUS: Phosphorus: 3.6 mg/dL (ref 2.5–4.6)

## 2023-10-16 MED ORDER — ALBUTEROL SULFATE (2.5 MG/3ML) 0.083% IN NEBU
2.5000 mg | INHALATION_SOLUTION | RESPIRATORY_TRACT | Status: DC | PRN
  Administered 2023-10-19: 2.5 mg via RESPIRATORY_TRACT
  Filled 2023-10-16: qty 3

## 2023-10-16 MED ORDER — LEVOTHYROXINE SODIUM 112 MCG PO TABS
112.0000 ug | ORAL_TABLET | Freq: Every day | ORAL | Status: DC
Start: 2023-10-17 — End: 2023-10-21
  Administered 2023-10-17 – 2023-10-21 (×5): 112 ug via ORAL
  Filled 2023-10-16 (×5): qty 1

## 2023-10-16 MED ORDER — LORAZEPAM 0.5 MG PO TABS
0.5000 mg | ORAL_TABLET | Freq: Every day | ORAL | Status: DC
  Administered 2023-10-17 – 2023-10-20 (×5): 0.5 mg via ORAL
  Filled 2023-10-16 (×5): qty 1

## 2023-10-16 MED ORDER — ACETAMINOPHEN 650 MG RE SUPP: 650.0000 mg | Freq: Four times a day (QID) | RECTAL | Status: DC | PRN

## 2023-10-16 MED ORDER — AZITHROMYCIN 250 MG PO TABS
500.0000 mg | ORAL_TABLET | Freq: Once | ORAL | Status: AC
  Administered 2023-10-16: 500 mg via ORAL
  Filled 2023-10-16: qty 2

## 2023-10-16 MED ORDER — SODIUM CHLORIDE 0.9 % IV SOLN
1.0000 g | Freq: Once | INTRAVENOUS | Status: AC
  Administered 2023-10-16: 1 g via INTRAVENOUS
  Filled 2023-10-16: qty 10

## 2023-10-16 MED ORDER — BUPROPION HCL ER (SR) 150 MG PO TB12
150.0000 mg | ORAL_TABLET | Freq: Every day | ORAL | Status: DC
Start: 2023-10-17 — End: 2023-10-21
  Administered 2023-10-17 – 2023-10-21 (×5): 150 mg via ORAL
  Filled 2023-10-16 (×5): qty 1

## 2023-10-16 MED ORDER — ALBUTEROL SULFATE HFA 108 (90 BASE) MCG/ACT IN AERS
2.0000 | INHALATION_SPRAY | RESPIRATORY_TRACT | Status: DC | PRN
  Administered 2023-10-16: 2 via RESPIRATORY_TRACT
  Filled 2023-10-16: qty 6.7

## 2023-10-16 MED ORDER — METHYLPREDNISOLONE SODIUM SUCC 125 MG IJ SOLR
80.0000 mg | INTRAMUSCULAR | Status: AC
  Administered 2023-10-17: 80 mg via INTRAVENOUS
  Filled 2023-10-16: qty 2

## 2023-10-16 MED ORDER — SODIUM CHLORIDE 0.9 % IV BOLUS
1000.0000 mL | Freq: Once | INTRAVENOUS | Status: AC
  Administered 2023-10-16: 1000 mL via INTRAVENOUS

## 2023-10-16 MED ORDER — GUAIFENESIN ER 600 MG PO TB12
600.0000 mg | ORAL_TABLET | Freq: Two times a day (BID) | ORAL | Status: DC
  Administered 2023-10-17 – 2023-10-21 (×10): 600 mg via ORAL
  Filled 2023-10-16 (×10): qty 1

## 2023-10-16 MED ORDER — ONDANSETRON HCL 4 MG/2ML IJ SOLN
4.0000 mg | Freq: Four times a day (QID) | INTRAMUSCULAR | Status: DC | PRN
  Administered 2023-10-18 – 2023-10-19 (×3): 4 mg via INTRAVENOUS
  Filled 2023-10-16 (×3): qty 2

## 2023-10-16 MED ORDER — ONDANSETRON HCL 4 MG PO TABS
4.0000 mg | ORAL_TABLET | Freq: Four times a day (QID) | ORAL | Status: DC | PRN
  Administered 2023-10-19: 4 mg via ORAL
  Filled 2023-10-16: qty 1

## 2023-10-16 MED ORDER — PREDNISONE 10 MG PO TABS
40.0000 mg | ORAL_TABLET | Freq: Every day | ORAL | Status: AC
  Administered 2023-10-18 – 2023-10-21 (×4): 40 mg via ORAL
  Filled 2023-10-16 (×4): qty 4

## 2023-10-16 MED ORDER — ACETAMINOPHEN 325 MG PO TABS: 650.0000 mg | ORAL_TABLET | Freq: Four times a day (QID) | ORAL | Status: DC | PRN

## 2023-10-16 MED ORDER — OSELTAMIVIR PHOSPHATE 30 MG PO CAPS
30.0000 mg | ORAL_CAPSULE | Freq: Every day | ORAL | Status: AC
  Administered 2023-10-17 – 2023-10-20 (×5): 30 mg via ORAL
  Filled 2023-10-16 (×5): qty 1

## 2023-10-16 MED ORDER — VENLAFAXINE HCL 25 MG PO TABS
25.0000 mg | ORAL_TABLET | Freq: Every day | ORAL | Status: DC
  Administered 2023-10-17 – 2023-10-20 (×4): 25 mg via ORAL
  Filled 2023-10-16 (×4): qty 1

## 2023-10-16 MED ORDER — HYDROCODONE-ACETAMINOPHEN 5-325 MG PO TABS
1.0000 | ORAL_TABLET | ORAL | Status: DC | PRN
  Administered 2023-10-18: 1 via ORAL
  Administered 2023-10-18 – 2023-10-19 (×2): 2 via ORAL
  Filled 2023-10-16 (×2): qty 2
  Filled 2023-10-16: qty 1

## 2023-10-16 MED ORDER — SODIUM CHLORIDE 0.9 % IV SOLN: 1.0000 g | INTRAVENOUS | Status: DC

## 2023-10-16 MED ORDER — IPRATROPIUM-ALBUTEROL 0.5-2.5 (3) MG/3ML IN SOLN
3.0000 mL | Freq: Four times a day (QID) | RESPIRATORY_TRACT | Status: DC
  Filled 2023-10-16: qty 3

## 2023-10-16 MED ORDER — SODIUM CHLORIDE 0.9 % IV SOLN
100.0000 mg | Freq: Two times a day (BID) | INTRAVENOUS | Status: DC
  Administered 2023-10-17: 100 mg via INTRAVENOUS
  Filled 2023-10-16 (×2): qty 100

## 2023-10-16 MED ORDER — SODIUM CHLORIDE 0.9 % IV SOLN: INTRAVENOUS | Status: AC

## 2023-10-16 NOTE — Assessment & Plan Note (Signed)
-   Check TSH continue home medications Synthroid at  112 mcg po q day

## 2023-10-16 NOTE — ED Triage Notes (Signed)
Patient arrives POV with complaints of worsening cough and shortness of breath. Patient is currently recovering from pneumonia (finished a course of antibiotics & steroids).

## 2023-10-16 NOTE — H&P (Signed)
Jeanne Ortiz:[id number] DOB: July 25, 1941 DOA: 10/16/2023     PCP: Trisha Mangle, FNP   [facility]    Dr. Sherene Sires   Patient arrived to ER on 10/16/23 at 1448 Referred by Attending Therisa Doyne, MD   Patient coming from:    home Lives  With family   Chief Complaint:   Chief Complaint  Patient presents with   Cough   Shortness of Breath    HPI: Jeanne Ortiz is a 83 y.o. female with medical history significant of asthma, hypertension hyperlipidemia hypothyroidism sleep apnea    Presented with worsening shortness of breath and cough Presents with worsening cough and shortness of breath recently was diagnosed with pneumonia treated with antibiotics and steroids But have had some lingering cough since 25 December.  Then suddenly about a week ago her symptoms have gotten severely worse she went to urgent care on 22 January and that showed right lower lobe pneumonia patient was given azithromycin and Augmentin and prednisone since she was wheezing.  At first the symptoms have improved but then became progressively worse and she started to have cough productive of yellow sputum and chills with worsening shortness of breath and congestion she returned to urgent care today with chest x-ray showing persistent pneumonia In the ER she was found to be flu a positive Otherwise no nausea no vomiting O2 sats 93% on room air found to have a bump of her creatinine up to 1.68 she was given IV fluids Rocephin azithromycin ice and today she was also given a dose of Decadron 10 mg at urgent care and   Reports decreased PO intake only had a few bananas  Denies significant ETOH intake   Does not smoke   Lab Results  Component Value Date   SARSCOV2NAA NEGATIVE 10/16/2023      Regarding pertinent Chronic problems:      HTN on Cozaar metoprolol (PRN) spironolactone     Hypothyroidism:   on synthroid   Morbid obesity-   BMI Readings from Last 1 Encounters:   10/16/23 41.95 kg/m       Asthma -well controlled on home inhalers               While in ER:    Found to have hyponatremia with sodium down to 129    Lab Orders         Resp panel by RT-PCR (RSV, Flu A&B, Covid) Anterior Nasal Swab         Basic metabolic panel         CBC with Differential         Lactic acid, plasma      CXR - Bronchial wall thickening and mild patchy opacities within the periphery of the right base and left base. Findings are concerning for multifocal pneumonia.    Following Medications were ordered in ER: Medications  albuterol (VENTOLIN HFA) 108 (90 Base) MCG/ACT inhaler 2 puff (2 puffs Inhalation Given 10/16/23 1829)  cefTRIAXone (ROCEPHIN) 1 g in sodium chloride 0.9 % 100 mL IVPB (0 g Intravenous Stopped 10/16/23 1729)  azithromycin (ZITHROMAX) tablet 500 mg (500 mg Oral Given 10/16/23 1649)  sodium chloride 0.9 % bolus 1,000 mL (0 mLs Intravenous Stopped 10/16/23 2123)       ED Triage Vitals  Encounter Vitals Group     BP 10/16/23 1458 118/65     Systolic BP Percentile --      Diastolic BP Percentile --  Pulse Rate 10/16/23 1458 99     Resp 10/16/23 1458 (!) 26     Temp 10/16/23 1458 99.2 F (37.3 C)     Temp Source 10/16/23 1829 Oral     SpO2 10/16/23 1458 94 %     Weight 10/16/23 1459 222 lb 0.1 oz (100.7 kg)     Height 10/16/23 1459 5\' 1"  (1.549 m)     Head Circumference --      Peak Flow --      Pain Score 10/16/23 1457 0     Pain Loc --      Pain Education --      Exclude from Growth Chart --   GNFA(21)@     _________________________________________ Significant initial  Findings: Abnormal Labs Reviewed  RESP PANEL BY RT-PCR (RSV, FLU A&B, COVID)  RVPGX2 - Abnormal; Notable for the following components:      Result Value   Influenza A by PCR POSITIVE (*)    All other components within normal limits  BASIC METABOLIC PANEL - Abnormal; Notable for the following components:   Sodium 129 (*)    CO2 21 (*)    Glucose, Bld 117 (*)     BUN 33 (*)    Creatinine, Ser 1.68 (*)    Calcium 8.4 (*)    GFR, Estimated 30 (*)    All other components within normal limits  CBC WITH DIFFERENTIAL/PLATELET - Abnormal; Notable for the following components:   WBC 12.0 (*)    Platelets 126 (*)    Neutro Abs 11.2 (*)    Lymphs Abs 0.3 (*)    Abs Immature Granulocytes 0.08 (*)    All other components within normal limits  COMPREHENSIVE METABOLIC PANEL - Abnormal; Notable for the following components:   Sodium 131 (*)    CO2 18 (*)    Glucose, Bld 155 (*)    BUN 31 (*)    Creatinine, Ser 1.47 (*)    Calcium 8.0 (*)    Total Protein 5.7 (*)    Albumin 2.9 (*)    GFR, Estimated 35 (*)    All other components within normal limits  BLOOD GAS, VENOUS - Abnormal; Notable for the following components:   pCO2, Ven 41 (*)    Acid-base deficit 4.8 (*)    All other components within normal limits     Cardiac Panel (last 3 results) Recent Labs    10/16/23 2318  CKTOTAL 207     ECG: Ordered Personally reviewed and interpreted by me showing: HR : 99 Rhythm:Normal sinus rhythm Septal infarct , age undetermined Abnormal ECG QTC 456    Lab Results  Component Value Date   SARSCOV2NAA NEGATIVE 10/16/2023    ____________________   The recent clinical data is shown below. Vitals:   10/16/23 2100 10/16/23 2110 10/16/23 2115 10/16/23 2218  BP: 114/62  119/65 110/71  Pulse: 70 71 71 78  Resp: 13 (!) 25 (!) 23 (!) 21  Temp:   97.9 F (36.6 C) 98 F (36.7 C)  TempSrc:   Oral Tympanic  SpO2: (!) 89% 91% 94% 96%  Weight:      Height:        WBC     Component Value Date/Time   WBC 12.0 (H) 10/16/2023 1642   LYMPHSABS 0.3 (L) 10/16/2023 1642   MONOABS 0.3 10/16/2023 1642   EOSABS 0.1 10/16/2023 1642   BASOSABS 0.0 10/16/2023 1642    Lactic Acid, Venous    Component Value Date/Time  LATICACIDVEN 0.6 10/16/2023 1811    Procalcitonin   Ordered      UA  ordered    Results for orders placed or performed during the  hospital encounter of 10/16/23  Resp panel by RT-PCR (RSV, Flu A&B, Covid) Anterior Nasal Swab     Status: Abnormal   Collection Time: 10/16/23  4:42 PM   Specimen: Anterior Nasal Swab  Result Value Ref Range Status   SARS Coronavirus 2 by RT PCR NEGATIVE NEGATIVE Final         Influenza A by PCR POSITIVE (A) NEGATIVE Final   Influenza B by PCR NEGATIVE NEGATIVE Final         Resp Syncytial Virus by PCR NEGATIVE NEGATIVE Final          ABX started Antibiotics Given (last 72 hours)     Date/Time Action Medication Dose Rate   10/16/23 1649 Given   azithromycin (ZITHROMAX) tablet 500 mg 500 mg    10/16/23 1652 New Bag/Given   cefTRIAXone (ROCEPHIN) 1 g in sodium chloride 0.9 % 100 mL IVPB 1 g 200 mL/hr      Venous  Blood Gas result:  pH   7.32 Acid-base deficit 4.8 High  mmol/L   pCO2, Ven 41 Low  mmHg O2 Saturation 80.8 %  pO2, Ven 45 mmHg      ____________________________________________________ Recent Labs  Lab 10/16/23 1642  NA 129*  K 3.9  CO2 21*  GLUCOSE 117*  BUN 33*  CREATININE 1.68*  CALCIUM 8.4*    Cr  Up from baseline see below Lab Results  Component Value Date   CREATININE 1.47 (H) 10/16/2023   CREATININE 1.68 (H) 10/16/2023   CREATININE 1.06 (H) 02/10/2023    Recent Labs  Lab 10/16/23 2318  AST 26  ALT 16  ALKPHOS 38  BILITOT 0.6  PROT 5.7*  ALBUMIN 2.9*   Lab Results  Component Value Date   CALCIUM 8.4 (L) 10/16/2023    Plt: Lab Results  Component Value Date   PLT 126 (L) 10/16/2023       Recent Labs  Lab 10/16/23 1642  WBC 12.0*  NEUTROABS 11.2*  HGB 12.7  HCT 37.3  MCV 86.9  PLT 126*    HG/HCT  stable,      Component Value Date/Time   HGB 12.7 10/16/2023 1642   HCT 37.3 10/16/2023 1642   MCV 86.9 10/16/2023 1642    _______________________________________________ Hospitalist was called for admission for   Influenza A with pneumonia    AKI, hyponatremia    The following Work up has been ordered so  far:  Orders Placed This Encounter  Procedures   Resp panel by RT-PCR (RSV, Flu A&B, Covid) Anterior Nasal Swab   DG Chest 2 View   Basic metabolic panel   CBC with Differential   Lactic acid, plasma   ED Cardiac monitoring   Utilize spacer/aerochamber with mdi inhaler for COVID-19 positive patients or PUI for COVID-19   If O2 Sat <94% administer O2 at 2 liters/minute via nasal cannula   Consult to hospitalist   ED EKG   EKG 12-Lead   Insert peripheral IV   Place in observation (patient's expected length of stay will be less than 2 midnights)     OTHER Significant initial  Findings:  labs showing:     DM  labs:  HbA1C: No results for input(s): "HGBA1C" in the last 8760 hours.     CBG (last 3)  No results for input(s): "GLUCAP"  in the last 72 hours.        Cultures: No results found for: "SDES", "SPECREQUEST", "CULT", "REPTSTATUS"   Radiological Exams on Admission: DG Chest 2 View Result Date: 10/16/2023 CLINICAL DATA:  Shortness of breath with worsening cough. EXAM: CHEST - 2 VIEW COMPARISON:  08/26/2022 FINDINGS: Heart size and mediastinal contours are normal. Aortic atherosclerosis and tortuosity. No pleural effusion or interstitial edema. Bronchial wall thickening identified. Mild patchy opacities within the periphery of the right base and left base noted. Visualized osseous structures are notable for a thoracic scoliosis deformity with convexity towards the right. IMPRESSION: Bronchial wall thickening and mild patchy opacities within the periphery of the right base and left base. Findings are concerning for multifocal pneumonia. Electronically Signed   By: Signa Kell M.D.   On: 10/16/2023 15:48   _______________________________________________________________________________________________________ Latest  Blood pressure 110/71, pulse 78, temperature 98 F (36.7 C), temperature source Tympanic, resp. rate (!) 21, height 5\' 1"  (1.549 m), weight 100.7 kg, SpO2 96%.    Vitals  labs and radiology finding personally reviewed  Review of Systems:    Pertinent positives include:    chills, fatigue,   shortness of breath at rest. excess mucus productive cough Constitutional:  No weight loss, night sweats, weight loss  HEENT:  No headaches, Difficulty swallowing,Tooth/dental problems,Sore throat,  No sneezing, itching, ear ache, nasal congestion, post nasal drip,  Cardio-vascular:  No chest pain, Orthopnea, PND, anasarca, dizziness, palpitations.no Bilateral lower extremity swelling  GI:  No heartburn, indigestion, abdominal pain, nausea, vomiting, diarrhea, change in bowel habits, loss of appetite, melena, blood in stool, hematemesis Resp:  no No dyspnea on exertion , No non-productive cough, No coughing up of blood.No change in color of mucus.No wheezing. Skin:  no rash or lesions. No jaundice GU:  no dysuria, change in color of urine, no urgency or frequency. No straining to urinate.  No flank pain.  Musculoskeletal:  No joint pain or no joint swelling. No decreased range of motion. No back pain.  Psych:  No change in mood or affect. No depression or anxiety. No memory loss.  Neuro: no localizing neurological complaints, no tingling, no weakness, no double vision, no gait abnormality, no slurred speech, no confusion  All systems reviewed and apart from HOPI all are negative _______________________________________________________________________________________________ Past Medical History:   Past Medical History:  Diagnosis Date   Anemia    Anxiety    Arthritis    Asthma    Chronic kidney disease    Depression    GERD (gastroesophageal reflux disease)    Headache    migraines,  seldom since menopause   Hypertension    Hypothyroidism    Pneumonia    PUD (peptic ulcer disease)    Thyroid disease      Past Surgical History:  Procedure Laterality Date   ABDOMINAL HYSTERECTOMY     CATARACT EXTRACTION Bilateral    w/ IOL   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 She has never used smokeless tobacco. She reports that she does not drink alcohol and does not use drugs.   Family History:  Family History  Problem Relation Age of Onset   Stroke Father    Diabetes Father    ______________________________________________________________________________________________ Allergies: Allergies  Allergen Reactions  Fentanyl And Related Nausea And Vomiting    Severe nausea and vomiting and severe migraine headache.   Morphine And Codeine Nausea And Vomiting    ANY NARCOTIC causes severe nausea and vomiting and severe migraine headache.   Other     brazile and macadamia nuts - migraines    Propofol Nausea And Vomiting   Sulfa Antibiotics Hives and Swelling     Prior to Admission medications   Medication Sig Start Date End Date Taking? Authorizing Provider  acetaminophen (TYLENOL) 500 MG tablet Take 1,000 mg by mouth every 6 (six) hours as needed for moderate pain.    [provider]  albuterol (PROAIR HFA) 108 (90 Base) MCG/ACT inhaler Inhale 2 puffs into the lungs every 4 (four) hours as needed. 12/15/22   Glenford Bayley, NP  aluminum-magnesium hydroxide-simethicone (MAALOX) 200-200-20 MG/5ML SUSP Take 30 mLs by mouth daily as needed (indigestion).    [provider]  aspirin 81 MG chewable tablet Chew 1 tablet (81 mg total) by mouth 2 (two) times daily. 02/17/23   Guy Sandifer, PA  B Complex-C (SUPER B COMPLEX PO) Take 1 capsule by mouth daily.    [provider]  budesonide-formoterol (SYMBICORT) 160-4.5 MCG/ACT inhaler TAKE 2 PUFFS FIRST THING IN AM AND THEN ANOTHER 2 PUFFS ABOUT 12 HOURS LATER. 08/25/23   Nyoka Cowden, MD  buPROPion (WELLBUTRIN XL) 150 MG 24 hr tablet  Take 150 mg by mouth daily. 12/22/22   [provider]  cetirizine-pseudoephedrine (ZYRTEC-D) 5-120 MG tablet Take 1 tablet by mouth 2 (two) times daily as needed for allergies.    [provider]  Cholecalciferol (VITAMIN D) 50 MCG (2000 UT) tablet Take 4,000 Units by mouth daily.    [provider]  Coenzyme Q10 (CO Q-10) 300 MG CAPS Take 300 mg by mouth daily.    [provider]  Dextromethorphan-guaiFENesin (MUCINEX DM MAXIMUM STRENGTH) 60-1200 MG TB12 Take 1 tablet by mouth 2 (two) times daily as needed (congestion).    [provider]  doxycycline (VIBRA-TABS) 100 MG tablet Take 1 tablet (100 mg total) by mouth 2 (two) times daily. 02/26/23   Leslye Peer, MD  famotidine (PEPCID) 20 MG tablet One after bfast and one  after supper Patient taking differently: Take 20 mg by mouth 2 (two) times daily as needed for heartburn. 10/16/20   Nyoka Cowden, MD  levothyroxine (SYNTHROID) 112 MCG tablet Take 112 mcg by mouth daily before breakfast.    [provider]  LORazepam (ATIVAN) 0.5 MG tablet Take 0.5 mg by mouth at bedtime.    [provider]  losartan (COZAAR) 50 MG tablet Take 50 mg by mouth daily.    [provider]  Magnesium 200 MG TABS Take 200 mg by mouth daily.    [provider]  metoprolol tartrate (LOPRESSOR) 25 MG tablet TAKE 1 TABLET BY MOUTH TWICE A DAY Patient taking differently: Take 25 mg by mouth 2 (two) times daily as needed (if extra high blood pressure). 07/27/21   Nyoka Cowden, MD  Multiple Vitamins-Minerals (LUTEIN-ZEAXANTHIN) TABS Take 1 tablet by mouth daily.    [provider]  Olopatadine HCl 0.2 % SOLN Apply 1 drop to eye daily as needed (for itchy eyes).    [provider]  Omega-3 300 MG CAPS Take 300 mg by mouth daily.    [provider]  ondansetron (ZOFRAN) 4 MG tablet Take 1 tablet (4 mg total) by mouth every 6 (six)  hours. 05/14/22   Curatolo, Adam, DO   oxymetazoline (AFRIN) 0.05 % nasal spray Place 1 spray into both nostrils daily as needed for congestion.    [provider]  Prasterone, DHEA, (DHEA 50) 50 MG CAPS Take 50 mg by mouth daily.    [provider]  Semaglutide,0.25 or 0.5MG /DOS, (OZEMPIC, 0.25 OR 0.5 MG/DOSE,) 2 MG/1.5ML SOPN Inject 0.25 mg into the skin every Friday.    [provider]  spironolactone (ALDACTONE) 50 MG tablet Take 50 mg by mouth daily.    [provider]  traMADol (ULTRAM) 50 MG tablet Take 1-2 tablets (50-100 mg total) by mouth every 6 (six) hours. 02/17/23   Guy Sandifer, PA  venlafaxine (EFFEXOR) 25 MG tablet Take 25 mg by mouth daily.    [provider]  vitamin E 180 MG (400 UNITS) capsule Take 400 Units by mouth daily.    [provider]  zinc gluconate 50 MG tablet Take 50 mg by mouth daily as needed (immune support).    [provider]   __________________________________________________________________________________________ Physical Exam:    10/16/2023   10:18 PM 10/16/2023    9:15 PM 10/16/2023    9:10 PM  Vitals with BMI  Systolic 110 119   Diastolic 71 65   Pulse 78 71 71    1. General:  in No  Acute distress   Chronically ill   -appearing 2. Psychological: Alert and   Oriented 3. Head/ENT:    Dry Mucous Membranes                          Head Non traumatic, neck supple                         Poor Dentition 4. SKIN:  decreased Skin turgor,  Skin clean Dry and intact no rash    5. Heart: Regular rate and rhythm no  Murmur, no Rub or gallop 6. Lungs:  some wheezes/crackles   7. Abdomen: Soft,  non-tender, Non distended   obese  bowel sounds present 8. Lower extremities: no clubbing, cyanosis, no  edema 9. Neurologically Grossly intact, moving all 4 extremities equally  10. MSK: Normal range of motion    Chart has been  reviewed  ______________________________________________________________________________________________  Assessment/Plan  83 y.o. female with medical history significant of asthma, hypertension hyperlipidemia hypothyroidism sleep apnea   Admitted for  Influenza A with pneumonia, AKI,   hyponatremia   Present on Admission:  Influenza A with pneumonia  Asthma exacerbation  Benign essential hypertension  Hypothyroidism  Obstructive sleep apnea  AKI (acute kidney injury) (HCC)  Hyponatremia     Asthma exacerbation  -  - Will initiate: Steroid taper  -  Antibiotics rocephin azithro - Albuterol  PRN, - scheduled duoneb,  -  Breo or Dulera at discharge   -  Mucinex.  Titrate O2 to saturation >90%. Follow patients respiratory status. influenza PCR A positive   VBG      Benign essential hypertension Allow permissive hypertension  Hypothyroidism - Check TSH continue home medications Synthroid at 112 mcg po q day   Influenza A with pneumonia Started on Tamiflu supportive management  Obstructive sleep apnea Not on CPAP  AKI (acute kidney injury) (HCC) Likely secondary to dehydration will rehydrate and follow Obtain urine electrolytes  Hyponatremia In the setting of dehydration Low to your electrolytes gently rehydrate and follow    Other plan as  per orders.  DVT prophylaxis:  SCD      Code Status:    Code Status: Prior FULL CODE  as per patient   I had personally discussed CODE STATUS with patient  ACP   none    Family Communication:   Family not at  Bedside    Diet heart healthy   Disposition Plan:      To home once workup is complete and patient is stable   Following barriers for discharge:                             Electrolytes corrected                             white count improving able to transition to PO antibiotics                                                   Would benefit from PT/OT eval prior to DC  Ordered                      Consults called: none     Admission status:  ED Disposition     ED Disposition  Admit   Condition  --   Comment  Hospital Area: [facility] [[id number]]  Level of Care: Med-Surg [16]  Interfacility transfer: Yes  May place patient in observation at [facility] or [facility] if equivalent level of care is available:: Yes  Covid Evaluation: Confirmed COVID Negative  Diagnosis: Influenza A with pneumonia [650095]  Admitting Physician: Orland Mustard [[id number]]  Attending Physician: Orland Mustard [[id number]]          Obs    Level of care    medical floor       Lab Results  Component Value Date   SARSCOV2NAA NEGATIVE 10/16/2023     Graylen Noboa 10/16/2023, 10:48 PM    [facility]     after 2 AM please page floor coverage PA If 7AM-7PM, please contact the day team taking care of the patient using [email or web address]

## 2023-10-16 NOTE — Assessment & Plan Note (Signed)
-  -   Will initiate: Steroid taper  -  Antibiotics rocephin azithro - Albuterol  PRN, - scheduled duoneb,  -  Breo or Dulera at discharge   -  Mucinex.  Titrate O2 to saturation >90%. Follow patients respiratory status. influenza PCR A positive   VBG

## 2023-10-16 NOTE — Assessment & Plan Note (Signed)
In the setting of dehydration Low to your electrolytes gently rehydrate and follow

## 2023-10-16 NOTE — Subjective & Objective (Signed)
Presents with worsening cough and shortness of breath recently was diagnosed with pneumonia treated with antibiotics and steroids But have had some lingering cough since 25 December.  Then suddenly about a week ago her symptoms have gotten severely worse she went to urgent care on 22 January and that showed right lower lobe pneumonia patient was given azithromycin and Augmentin and prednisone since she was wheezing.  At first the symptoms have improved but then became progressively worse and she started to have cough productive of yellow sputum and chills with worsening shortness of breath and congestion she returned to urgent care today with chest x-ray showing persistent pneumonia In the ER she was found to be flu a positive Otherwise no nausea no vomiting

## 2023-10-16 NOTE — Assessment & Plan Note (Signed)
Likely secondary to dehydration will rehydrate and follow Obtain urine electrolytes

## 2023-10-16 NOTE — Assessment & Plan Note (Signed)
Allow permissive hypertension 

## 2023-10-16 NOTE — Progress Notes (Signed)
Plan of Care Note for accepted transfer   Patient: Jeanne Ortiz MRN: [id number]   DOA: 10/16/2023  [facility]ility [facility] transfer: [facility] Requesting Provider: Fayrene Helper, PA  Reason for transfer: pneumonia  Facility course: 83 year old with medical hx significant for cough variant asthma, HTN, HLD, hypothyroidism, OSA who presented to Urgent Care for worsening shortness of breath and cough. She was seen last week and diagnosed with pneumonia. Given a zpack and augmentin and prednisone on 1/22. Came to urgent care where she was sent to ED due to CXR findings of persistent patchy opacity in lateral aspect of RLL despite antibiotics. She was found to be flu A positive in ED today. Also found to have AKI.   Vitals: stable. RR: 24, oxygen: 93%RA. HR: 87, temp: afebrile Pertinent labs: sodium: 129, BUN: 33, creatinine: 1.68, FLU: A positive.  Given 1L IVF, rocephin and azithromycin. [facility] asked to admit.   Given decadron 10mg  at urgent care.   Plan of care: The patient is accepted for admission to Med-surg  unit, at [facility]. For influenza A with pneumonia, AKI and hyponatremia    Author: Orland Mustard, MD 10/16/2023  Check [email or web address] for on-call coverage.  Nursing staff, Please call [facility] Admits & Consults System-Wide number on Amion as soon as patient's arrival, so appropriate admitting provider can evaluate the pt.

## 2023-10-16 NOTE — ED Notes (Signed)
Pt leaving to transport to [facility] at this time. No acute distress noted upon departure.

## 2023-10-16 NOTE — Assessment & Plan Note (Signed)
Started on Tamiflu supportive management

## 2023-10-16 NOTE — ED Provider Notes (Signed)
[place] EMERGENCY DEPARTMENT AT [facility] Provider Note   CSN: [id number] Arrival date & time: 10/16/23  1448     History  Chief Complaint  Patient presents with   Cough   Shortness of Breath    Jeanne Ortiz is a 83 y.o. female.  The history is provided by the patient and medical records. No language interpreter was used.  Cough Associated symptoms: shortness of breath   Shortness of Breath Associated symptoms: cough     83 year old female presents for evaluation of shortness of breath and cough.  Patient has had a lingering cough since 09/08/2023.  A week ago, her symptoms became progressively worse and she was seen at urgent care center on 10/06/2023.  At that time workup was remarkable for right lower lobe pneumonia patient was treated for pneumonia with azithromycin and Augmentin.  She was also prescribed prednisone for wheezing.  She mention after taking the antibiotic her symptoms seems to be improved however in the past 3 days and has become progressively worse with increased productive cough of yellow sputum, having chills, increased shortness of breath, sinus congestion.  She went to urgent care center today but did have a chest x-ray that shows pneumonia and she was recommended to come to the ED the ER to be admitted to the hospital for IV antibiotic.  She does not endorse any nausea vomiting diarrhea but she endorsed decrease in appetite.  Home Medications Prior to Admission medications   Medication Sig Start Date End Date Taking? Authorizing Provider  acetaminophen (TYLENOL) 500 MG tablet Take 1,000 mg by mouth every 6 (six) hours as needed for moderate pain.    [provider]  albuterol (PROAIR HFA) 108 (90 Base) MCG/ACT inhaler Inhale 2 puffs into the lungs every 4 (four) hours as needed. 12/15/22   Glenford Bayley, NP  aluminum-magnesium hydroxide-simethicone (MAALOX) 200-200-20 MG/5ML SUSP Take 30 mLs by mouth daily as needed (indigestion).     [provider]  aspirin 81 MG chewable tablet Chew 1 tablet (81 mg total) by mouth 2 (two) times daily. 02/17/23   Guy Sandifer, PA  B Complex-C (SUPER B COMPLEX PO) Take 1 capsule by mouth daily.    [provider]  budesonide-formoterol (SYMBICORT) 160-4.5 MCG/ACT inhaler TAKE 2 PUFFS FIRST THING IN AM AND THEN ANOTHER 2 PUFFS ABOUT 12 HOURS LATER. 08/25/23   Nyoka Cowden, MD  buPROPion (WELLBUTRIN XL) 150 MG 24 hr tablet Take 150 mg by mouth daily. 12/22/22   [provider]  cetirizine-pseudoephedrine (ZYRTEC-D) 5-120 MG tablet Take 1 tablet by mouth 2 (two) times daily as needed for allergies.    [provider]  Cholecalciferol (VITAMIN D) 50 MCG (2000 UT) tablet Take 4,000 Units by mouth daily.    [provider]  Coenzyme Q10 (CO Q-10) 300 MG CAPS Take 300 mg by mouth daily.    [provider]  Dextromethorphan-guaiFENesin (MUCINEX DM MAXIMUM STRENGTH) 60-1200 MG TB12 Take 1 tablet by mouth 2 (two) times daily as needed (congestion).    [provider]  doxycycline (VIBRA-TABS) 100 MG tablet Take 1 tablet (100 mg total) by mouth 2 (two) times daily. 02/26/23   Leslye Peer, MD  famotidine (PEPCID) 20 MG tablet One after bfast and one  after supper Patient taking differently: Take 20 mg by mouth 2 (two) times daily as needed for heartburn. 10/16/20   Nyoka Cowden, MD  levothyroxine (SYNTHROID) 112 MCG tablet Take 112 mcg  by mouth daily before breakfast.    [provider]  LORazepam (ATIVAN) 0.5 MG tablet Take 0.5 mg by mouth at bedtime.    [provider]  losartan (COZAAR) 50 MG tablet Take 50 mg by mouth daily.    [provider]  Magnesium 200 MG TABS Take 200 mg by mouth daily.    [provider]  metoprolol tartrate (LOPRESSOR) 25 MG tablet TAKE 1 TABLET BY MOUTH TWICE A DAY Patient taking differently: Take 25 mg by mouth 2 (two) times daily as needed (if extra high blood  pressure). 07/27/21   Nyoka Cowden, MD  Multiple Vitamins-Minerals (LUTEIN-ZEAXANTHIN) TABS Take 1 tablet by mouth daily.    [provider]  Olopatadine HCl 0.2 % SOLN Apply 1 drop to eye daily as needed (for itchy eyes).    [provider]  Omega-3 300 MG CAPS Take 300 mg by mouth daily.    [provider]  ondansetron (ZOFRAN) 4 MG tablet Take 1 tablet (4 mg total) by mouth every 6 (six) hours. 05/14/22   Curatolo, Adam, DO  oxymetazoline (AFRIN) 0.05 % nasal spray Place 1 spray into both nostrils daily as needed for congestion.    [provider]  Prasterone, DHEA, (DHEA 50) 50 MG CAPS Take 50 mg by mouth daily.    [provider]  Semaglutide,0.25 or 0.5MG /DOS, (OZEMPIC, 0.25 OR 0.5 MG/DOSE,) 2 MG/1.5ML SOPN Inject 0.25 mg into the skin every Friday.    [provider]  spironolactone (ALDACTONE) 50 MG tablet Take 50 mg by mouth daily.    [provider]  traMADol (ULTRAM) 50 MG tablet Take 1-2 tablets (50-100 mg total) by mouth every 6 (six) hours. 02/17/23   Guy Sandifer, PA  venlafaxine (EFFEXOR) 25 MG tablet Take 25 mg by mouth daily.    [provider]  vitamin E 180 MG (400 UNITS) capsule Take 400 Units by mouth daily.    [provider]  zinc gluconate 50 MG tablet Take 50 mg by mouth daily as needed (immune support).    [provider]      Allergies    Fentanyl and related, Morphine and codeine, Other, Propofol, and Sulfa antibiotics    Review of Systems   Review of Systems  Respiratory:  Positive for cough and shortness of breath.   All other systems reviewed and are negative.   Physical Exam Updated Vital Signs BP 118/65 (BP Location: Right Arm)   Pulse 99   Temp 99.2 F (37.3 C)   Resp (!) 26   Ht 5\' 1"  (1.549 m)   Wt 100.7 kg   SpO2 94%   BMI 41.95 kg/m  Physical Exam Vitals and nursing note reviewed.  Constitutional:      General: She is not in acute distress.     Appearance: She is well-developed. She is obese.  HENT:     Head: Atraumatic.  Eyes:     Conjunctiva/sclera: Conjunctivae normal.  Cardiovascular:     Rate and Rhythm: Normal rate and regular rhythm.  Pulmonary:     Effort: Pulmonary effort is normal. Tachypnea present.     Breath sounds: Examination of the right-lower field reveals rales. Rales present.  Musculoskeletal:     Cervical back: Neck supple.     Right lower leg: No edema.     Left lower leg: No edema.  Skin:    Findings: No rash.  Neurological:     Mental Status: She is alert.  Psychiatric:        Mood and Affect: Mood normal.     ED Results / Procedures / Treatments   Labs (all labs ordered are listed, but only abnormal results are displayed) Labs Reviewed  RESP PANEL BY RT-PCR (RSV, FLU A&B, COVID)  RVPGX2 - Abnormal; Notable for the following components:      Result Value   Influenza A by PCR POSITIVE (*)    All other components within normal limits  BASIC METABOLIC PANEL - Abnormal; Notable for the following components:   Sodium 129 (*)    CO2 21 (*)    Glucose, Bld 117 (*)    BUN 33 (*)    Creatinine, Ser 1.68 (*)    Calcium 8.4 (*)    GFR, Estimated 30 (*)    All other components within normal limits  CBC WITH DIFFERENTIAL/PLATELET - Abnormal; Notable for the following components:   WBC 12.0 (*)    Platelets 126 (*)    Neutro Abs 11.2 (*)    Lymphs Abs 0.3 (*)    Abs Immature Granulocytes 0.08 (*)    All other components within normal limits  LACTIC ACID, PLASMA  LACTIC ACID, PLASMA    EKG None ED ECG REPORT   Date: 10/16/2023  Rate: 99  Rhythm: normal sinus rhythm  QRS Axis: normal  Intervals: normal  ST/T Wave abnormalities: nonspecific ST changes  Conduction Disutrbances:none  Narrative Interpretation:   Old EKG Reviewed: unchanged  I have personally reviewed the EKG tracing and agree with the computerized printout as noted.   Radiology DG Chest 2 View Result Date:  10/16/2023 CLINICAL DATA:  Shortness of breath with worsening cough. EXAM: CHEST - 2 VIEW COMPARISON:  08/26/2022 FINDINGS: Heart size and mediastinal contours are normal. Aortic atherosclerosis and tortuosity. No pleural effusion or interstitial edema. Bronchial wall thickening identified. Mild patchy opacities within the periphery of the right base and left base noted. Visualized osseous structures are notable for a thoracic scoliosis deformity with convexity towards the right. IMPRESSION: Bronchial wall thickening and mild patchy opacities within the periphery of the right base and left base. Findings are concerning for multifocal pneumonia. Electronically Signed   By: Signa Kell M.D.   On: 10/16/2023 15:48    Procedures Procedures    Medications Ordered in ED Medications  albuterol (VENTOLIN HFA) 108 (90 Base) MCG/ACT inhaler 2 puff (2 puffs Inhalation Given 10/16/23 1829)  cefTRIAXone (ROCEPHIN) 1 g in sodium chloride 0.9 % 100 mL IVPB (0 g Intravenous Stopped 10/16/23 1729)  azithromycin (ZITHROMAX) tablet 500 mg (500 mg Oral Given 10/16/23 1649)  sodium chloride 0.9 % bolus 1,000 mL (1,000 mLs Intravenous New Bag/Given 10/16/23 1827)    ED Course/ Medical Decision Making/ A&P                                 Medical Decision Making Amount and/or Complexity of Data Reviewed Labs: ordered. Radiology: ordered.  Risk Prescription drug management.   BP 118/65 (BP Location: Right Arm)   Pulse 99   Temp 99.2 F (37.3 C)   Resp (!) 26   Ht 5\' 1"  (1.549 m)   Wt 100.7 kg   SpO2 94%   BMI 41.95 kg/m   68:40 PM 83 year old female presents for evaluation of shortness of breath and cough.  Patient has had a lingering cough since 09/08/2023.  A week ago, her symptoms became progressively worse  and she was seen at urgent care center on 10/06/2023.  At that time workup was remarkable for right lower lobe pneumonia patient was treated for pneumonia with azithromycin and Augmentin.  She was also  prescribed prednisone for wheezing.  She mention after taking the antibiotic her symptoms seems to be improved however in the past 3 days and has become progressively worse with increased productive cough of yellow sputum, having chills, increased shortness of breath, sinus congestion.  She went to urgent care center today but did have a chest x-ray that shows pneumonia and she was recommended to come to the ED the ER to be admitted to the hospital for IV antibiotic.  She does not endorse any nausea vomiting diarrhea but she endorsed decrease in appetite.  On exam, mild distress, she does have some crackles heard to her right lower lung base, no peripheral edema.  Patient is mildly tachypneic.  -Labs ordered, independently viewed and interpreted by me.  Labs remarkable for Cr 1.68 worse than baseline.  IVF given.  Na+ 129, which IVF should help.  WBC 12 however normal lactic acid.  Pt is fluA+ consistent with her presentation.  -The patient was maintained on a cardiac monitor.  I personally viewed and interpreted the cardiac monitored which showed an underlying rhythm of: NSR -Imaging independently viewed and interpreted by me and I agree with radiologist's interpretation.  Result remarkable for CXR showing multifocal pneumonia -This patient presents to the ED for concern of cold sxs, this involves an extensive number of treatment options, and is a complaint that carries with it a high risk of complications and morbidity.  The differential diagnosis includes covid, flu, rsv, pneumonia, viral illness, pe -Co morbidities that complicate the patient evaluation includes asthma, gerd, anemia, anxiety, htn -Treatment includes rocephin, zithromax, ivf -Reevaluation of the patient after these medicines showed that the patient improved -PCP office notes or outside notes reviewed -Discussion with specialist triad hospitalist Dr. Artis Flock who agrees to admit pt for further care -Escalation to admission/observation  considered: patient is agreeable with admission         Final Clinical Impression(s) / ED Diagnoses Final diagnoses:  Influenza A with pneumonia  AKI (acute kidney injury) [facility])    Rx / DC Orders ED Discharge Orders     None         Fayrene Helper, PA-C 10/16/23 1839    Anders Simmonds T, DO 10/19/23 [phone]

## 2023-10-17 DIAGNOSIS — Z7982 Long term (current) use of aspirin: Secondary | ICD-10-CM | POA: Diagnosis not present

## 2023-10-17 DIAGNOSIS — E871 Hypo-osmolality and hyponatremia: Secondary | ICD-10-CM | POA: Diagnosis present

## 2023-10-17 DIAGNOSIS — Z7951 Long term (current) use of inhaled steroids: Secondary | ICD-10-CM | POA: Diagnosis not present

## 2023-10-17 DIAGNOSIS — Z79899 Other long term (current) drug therapy: Secondary | ICD-10-CM | POA: Diagnosis not present

## 2023-10-17 DIAGNOSIS — E039 Hypothyroidism, unspecified: Secondary | ICD-10-CM | POA: Diagnosis present

## 2023-10-17 DIAGNOSIS — R0902 Hypoxemia: Secondary | ICD-10-CM | POA: Diagnosis not present

## 2023-10-17 DIAGNOSIS — R059 Cough, unspecified: Secondary | ICD-10-CM | POA: Diagnosis present

## 2023-10-17 DIAGNOSIS — Z8711 Personal history of peptic ulcer disease: Secondary | ICD-10-CM | POA: Diagnosis not present

## 2023-10-17 DIAGNOSIS — G4733 Obstructive sleep apnea (adult) (pediatric): Secondary | ICD-10-CM | POA: Diagnosis present

## 2023-10-17 DIAGNOSIS — E860 Dehydration: Secondary | ICD-10-CM | POA: Diagnosis present

## 2023-10-17 DIAGNOSIS — J45901 Unspecified asthma with (acute) exacerbation: Secondary | ICD-10-CM | POA: Diagnosis present

## 2023-10-17 DIAGNOSIS — Z6841 Body Mass Index (BMI) 40.0 and over, adult: Secondary | ICD-10-CM | POA: Diagnosis not present

## 2023-10-17 DIAGNOSIS — I129 Hypertensive chronic kidney disease with stage 1 through stage 4 chronic kidney disease, or unspecified chronic kidney disease: Secondary | ICD-10-CM | POA: Diagnosis present

## 2023-10-17 DIAGNOSIS — F32A Depression, unspecified: Secondary | ICD-10-CM | POA: Diagnosis present

## 2023-10-17 DIAGNOSIS — J09X1 Influenza due to identified novel influenza A virus with pneumonia: Secondary | ICD-10-CM | POA: Diagnosis present

## 2023-10-17 DIAGNOSIS — N1831 Chronic kidney disease, stage 3a: Secondary | ICD-10-CM | POA: Diagnosis present

## 2023-10-17 DIAGNOSIS — E785 Hyperlipidemia, unspecified: Secondary | ICD-10-CM | POA: Diagnosis present

## 2023-10-17 DIAGNOSIS — Z79890 Hormone replacement therapy (postmenopausal): Secondary | ICD-10-CM | POA: Diagnosis not present

## 2023-10-17 DIAGNOSIS — Z833 Family history of diabetes mellitus: Secondary | ICD-10-CM | POA: Diagnosis not present

## 2023-10-17 DIAGNOSIS — J111 Influenza due to unidentified influenza virus with other respiratory manifestations: Secondary | ICD-10-CM | POA: Diagnosis present

## 2023-10-17 DIAGNOSIS — Z96652 Presence of left artificial knee joint: Secondary | ICD-10-CM | POA: Diagnosis present

## 2023-10-17 DIAGNOSIS — F39 Unspecified mood [affective] disorder: Secondary | ICD-10-CM | POA: Diagnosis present

## 2023-10-17 DIAGNOSIS — N179 Acute kidney failure, unspecified: Secondary | ICD-10-CM | POA: Diagnosis present

## 2023-10-17 DIAGNOSIS — Z1152 Encounter for screening for COVID-19: Secondary | ICD-10-CM | POA: Diagnosis not present

## 2023-10-17 DIAGNOSIS — Z823 Family history of stroke: Secondary | ICD-10-CM | POA: Diagnosis not present

## 2023-10-17 LAB — CBC
HCT: 32.5 % — ABNORMAL LOW (ref 36.0–46.0)
Hemoglobin: 10.9 g/dL — ABNORMAL LOW (ref 12.0–15.0)
MCH: 29.5 pg (ref 26.0–34.0)
MCHC: 33.5 g/dL (ref 30.0–36.0)
MCV: 88.1 fL (ref 80.0–100.0)
Platelets: 119 10*3/uL — ABNORMAL LOW (ref 150–400)
RBC: 3.69 MIL/uL — ABNORMAL LOW (ref 3.87–5.11)
RDW: 13.2 % (ref 11.5–15.5)
WBC: 5.8 10*3/uL (ref 4.0–10.5)
nRBC: 0 % (ref 0.0–0.2)

## 2023-10-17 LAB — COMPREHENSIVE METABOLIC PANEL
ALT: 14 U/L (ref 0–44)
AST: 24 U/L (ref 15–41)
Albumin: 2.8 g/dL — ABNORMAL LOW (ref 3.5–5.0)
Alkaline Phosphatase: 36 U/L — ABNORMAL LOW (ref 38–126)
Anion gap: 8 (ref 5–15)
BUN: 32 mg/dL — ABNORMAL HIGH (ref 8–23)
CO2: 22 mmol/L (ref 22–32)
Calcium: 7.9 mg/dL — ABNORMAL LOW (ref 8.9–10.3)
Chloride: 101 mmol/L (ref 98–111)
Creatinine, Ser: 1.37 mg/dL — ABNORMAL HIGH (ref 0.44–1.00)
GFR, Estimated: 39 mL/min — ABNORMAL LOW (ref >60)
Glucose, Bld: 154 mg/dL — ABNORMAL HIGH (ref 70–99)
Potassium: 4 mmol/L (ref 3.5–5.1)
Sodium: 131 mmol/L — ABNORMAL LOW (ref 135–145)
Total Bilirubin: 0.4 mg/dL (ref 0.0–1.2)
Total Protein: 5.4 g/dL — ABNORMAL LOW (ref 6.5–8.1)

## 2023-10-17 LAB — PROCALCITONIN: Procalcitonin: 1.7 ng/mL

## 2023-10-17 LAB — MAGNESIUM: Magnesium: 2.3 mg/dL (ref 1.7–2.4)

## 2023-10-17 LAB — OSMOLALITY, URINE: Osmolality, Ur: 486 mosm/kg (ref 300–900)

## 2023-10-17 LAB — URINALYSIS, COMPLETE (UACMP) WITH MICROSCOPIC
Bacteria, UA: NONE SEEN
Bilirubin Urine: NEGATIVE
Glucose, UA: NEGATIVE mg/dL
Hgb urine dipstick: NEGATIVE
Ketones, ur: 5 mg/dL — AB
Leukocytes,Ua: NEGATIVE
Nitrite: NEGATIVE
Protein, ur: NEGATIVE mg/dL
Specific Gravity, Urine: 1.015 (ref 1.005–1.030)
pH: 5 (ref 5.0–8.0)

## 2023-10-17 LAB — STREP PNEUMONIAE URINARY ANTIGEN: Strep Pneumo Urinary Antigen: NEGATIVE

## 2023-10-17 LAB — EXPECTORATED SPUTUM ASSESSMENT W GRAM STAIN, RFLX TO RESP C

## 2023-10-17 LAB — PHOSPHORUS: Phosphorus: 3.8 mg/dL (ref 2.5–4.6)

## 2023-10-17 LAB — MRSA NEXT GEN BY PCR, NASAL: MRSA by PCR Next Gen: NOT DETECTED

## 2023-10-17 LAB — CREATININE, URINE, RANDOM: Creatinine, Urine: 131 mg/dL

## 2023-10-17 LAB — TSH: TSH: 0.766 u[IU]/mL (ref 0.350–4.500)

## 2023-10-17 LAB — GLUCOSE, CAPILLARY: Glucose-Capillary: 141 mg/dL — ABNORMAL HIGH (ref 70–99)

## 2023-10-17 LAB — SODIUM, URINE, RANDOM: Sodium, Ur: <10 mmol/L

## 2023-10-17 MED ORDER — DOXYCYCLINE HYCLATE 100 MG PO TABS
100.0000 mg | ORAL_TABLET | Freq: Two times a day (BID) | ORAL | Status: DC
  Administered 2023-10-17 – 2023-10-21 (×9): 100 mg via ORAL
  Filled 2023-10-17 (×9): qty 1

## 2023-10-17 MED ORDER — SODIUM CHLORIDE 0.9 % IV SOLN
2.0000 g | INTRAVENOUS | Status: DC
  Administered 2023-10-17 – 2023-10-20 (×4): 2 g via INTRAVENOUS
  Filled 2023-10-17 (×4): qty 20

## 2023-10-17 MED ORDER — IPRATROPIUM-ALBUTEROL 0.5-2.5 (3) MG/3ML IN SOLN: 3.0000 mL | Freq: Four times a day (QID) | RESPIRATORY_TRACT | Status: DC | PRN

## 2023-10-17 NOTE — Care Management Obs Status (Signed)
MEDICARE OBSERVATION STATUS NOTIFICATION   Patient Details  Name: Jeanne Ortiz MRN: [id number] Date of Birth: 03/17/1941   Medicare Observation Status Notification Given:  Yes    Lawerance Sabal, RN 10/17/2023, 3:28 PM

## 2023-10-17 NOTE — Progress Notes (Signed)
  Progress Note   Patient: Jeanne Ortiz OEV:[id number] DOB: 1941-03-07 DOA: 10/16/2023     0 DOS: the patient was seen and examined on 10/17/2023 at 10:15AM      Brief hospital course: 83 y.o. F with history morbid obesity, asthma, HTN, HLD, hypothyroidism who presented with influenza.  Had an asthma exacerbation last December, with some lingering cough since then.  About a week ago, symptoms got considerably worse, she went to urgent care 1 week ago, diagnosed with pneumonia, given azithromycin, Augmentin, prednisone.  At first she was getting better, then she started to get worse again so she came to the ER where she was found to have influenza.     Assessment and Plan: Influenza A -Continue Tamiflu - Continue supportive care, flutter and incentive   Asthma exacerbation PFTs in 2021 showed FEV1 to FVC ratio 0.76.  She has a history of reactive airway disease.  On Symbicort as an outpatient She is still extremely wheezy, feels out of breath, and hypoxic this morning. -Continue steroids - Continue bronchodilators - Continue antibiotics   Hypertension -Hold losartan due to AKI - Hold metoprolol  Hypothyroidism -Continue levothyroxine  Morbid obesity -Hold home semaglutide  Hyponatremia Sodium stable at 131, asymptomatic - Stop fluids  AKI on Chronic kidney disease stage IIIa Creatinine 1.6 on admission from baseline 1.1.  Down to 1.3 fluids overnight - Stop IV fluids - Push oral fluids - Trend creatinine  Mood disorder - Continue bupropion, lorazepam, venlafaxine        Subjective: Patient still quite wheezy this morning, feeling weak and tired and short of breath.  No fever, no confusion.     Physical Exam: BP (!) 142/74 (BP Location: Left Arm)   Pulse 74   Temp 97.6 F (36.4 C) (Oral)   Resp 19   Ht 5\' 1"  (1.549 m)   Wt 100.7 kg   SpO2 95%   BMI 41.95 kg/m   Elderly adult female, obese, sitting up in bed, interactive and appropriate RRR, no  murmurs, no peripheral edema Respirations seem somewhat increased, she is very wheezy in all lung fields, no rales appreciated, coughs frequently Abdomen soft no tenderness palpation Attention normal, affect appropriate, judgment and insight appear normal    Data Reviewed: The basic metabolic panel shows sodium 131, no change Creatinine down to 1.37 CBC shows mild dilution    Family Communication: None present    Disposition: Status is: Inpatient The patient was admitted with influenza and asthma exacerbation  She is improving, but in the setting of her age 80, recent AKI, and persistent oxygen requirement, I recommend that we continue inpatient treatment 24 hours, likely home tomorrow        Author: Alberteen Sam, MD 10/17/2023 6:26 PM  For on call review [email or web address].

## 2023-10-17 NOTE — Evaluation (Addendum)
Physical Therapy Evaluation Patient Details Name: Jeanne Ortiz MRN: [id number] DOB: August 02, 1941 Today's Date: 10/17/2023  History of Present Illness  Pt is an 83 yo female who presented to [facility] from urgent care on 10/16/23 with worsening SOB and cough. Pt with PNA diagnosis through urgent care on 1/22 and in ED on 2/1 found to have persistent PNA, Flu A+, and AKI. PMH: asthma, HTN, HLD, hypothyroidism, OSA  Clinical Impression  Patient presents with decreased mobility due to generalized weakness and decreased activity tolerance.  Reports she normally completes all ADL and most IADL's with cane use outdoors.  Patient currently S for outside room ambulation with occasional wall rail use and CGA to S for safety on stairs, notes ambulating independent in the room to bathroom.  SpO2 maintained 90-95% throughout mobility this session while on RA.  She was wheezing and with 3/4 DOE after stair negotiation but able to recover with rest.  Feel she is stable for home without follow up PT and should progress safely with intermittent family support.  PT will sign off.  Continue hallway ambulation with nursing supervision.       If plan is discharge home, recommend the following: Assistance with cooking/housework;Help with stairs or ramp for entrance   Can travel by private vehicle        Equipment Recommendations None recommended by PT  Recommendations for Other Services       Functional Status Assessment Patient has had a recent decline in their functional status and demonstrates the ability to make significant improvements in function in a reasonable and predictable amount of time.     Precautions / Restrictions        Mobility  Bed Mobility Overal bed mobility: Modified Independent             General bed mobility comments: increased time and painful on L flankl    Transfers Overall transfer level: Modified independent Equipment used: None                     Ambulation/Gait Ambulation/Gait assistance: Modified independent (Device/Increase time) Gait Distance (Feet): 250 Feet Assistive device: None (occasional use of wall rail) Gait Pattern/deviations: Step-through pattern, Decreased stride length, Shuffle       General Gait Details: slower pace, mild instability though using rail on wall occasionally; uses cane for outings at baseline  Stairs Stairs: Yes Stairs assistance: Supervision, Contact guard assist Stair Management: One rail Right, One rail Left, Alternating pattern, Forwards Number of Stairs: 3 General stair comments: used L rail to ascend, R to descend, intentionally using step through pattern; DOE 3/4 after stairs; held rail and took time to catch her breath, SpO2 90% at lowest, HR 109  Wheelchair Mobility     Tilt Bed    Modified Rankin (Stroke Patients Only)       Balance Overall balance assessment: Needs assistance   Sitting balance-Leahy Scale: Good     Standing balance support: No upper extremity supported Standing balance-Leahy Scale: Good Standing balance comment: no UE support for ambulation though using wall rail at times for support                             Pertinent Vitals/Pain Pain Assessment Pain Assessment: Faces Faces Pain Scale: Hurts even more Pain Location: L flank when coughing or during supine<>sit Pain Descriptors / Indicators: Aching, Grimacing, Guarding Pain Intervention(s): Monitored during session, Other (comment) (educated on splinting with  rolled blanket)    Home Living Family/patient expects to be discharged to:: Private residence Living Arrangements: Children Available Help at Discharge: Family Type of Home: House Home Access: Stairs to enter Entrance Stairs-Rails: Doctor, general practice of Steps: 4   Home Layout: One level Home Equipment: Cane - single Librarian, academic (2 wheels);Shower seat      Prior Function Prior Level of Function :  Independent/Modified Independent             Mobility Comments: using SPC when going out, 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       Extremity/Trunk Assessment   Upper Extremity Assessment Upper Extremity Assessment: Overall WFL for tasks assessed    Lower Extremity Assessment Lower Extremity Assessment: LLE deficits/detail LLE Deficits / Details: knee extension strength 4+/5 (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)    Cervical / Trunk Assessment Cervical / Trunk Assessment: Kyphotic  Communication   Communication Communication: No apparent difficulties  Cognition Arousal: Alert Behavior During Therapy: WFL for tasks assessed/performed Overall Cognitive Status: Within Functional Limits for tasks assessed                                          General Comments General comments (skin integrity, edema, etc.): SpO2 on RA throughout range 90-95%; RN aware; discussed footwear due to 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 and noted velcro strap sandals with arch supports; encouraged to continue to use for balance/safety; also discussed energy conservation    Exercises     Assessment/Plan    PT Assessment Patient does not need any further PT services  PT Problem List         PT Treatment Interventions      PT Goals (Current goals can be found in the Care Plan section)  Acute Rehab PT Goals Patient Stated Goal: return home PT Goal Formulation: All assessment and education complete, DC therapy    Frequency       Co-evaluation               AM-PAC PT "6 Clicks" Mobility  Outcome Measure Help needed turning from your back to your side while in a flat bed without using bedrails?: None Help needed moving from lying on your back to sitting on the side of a flat bed without using bedrails?: None Help needed moving to and from a bed to a chair (including a wheelchair)?: None Help needed standing up from a chair using your arms (e.g., wheelchair or bedside chair)?: None Help needed to  walk in hospital room?: None Help needed climbing 3-5 steps with a railing? : A Little 6 Click Score: 23    End of Session Equipment Utilized During Treatment: Gait belt Activity Tolerance: Patient limited by fatigue Patient left: in bed;with call bell/phone within reach   PT Visit Diagnosis: Muscle weakness (generalized) (M62.81)    Time: 1914-7829 PT Time Calculation (min) (ACUTE ONLY): 29 min   Charges:   PT Evaluation $PT Eval Moderate Complexity: 1 Mod PT Treatments $Gait Training: 8-22 mins PT General Charges $$ ACUTE PT VISIT: 1 Visit         Sheran Lawless, PT Acute Rehabilitation Services [phone] 10/17/2023   Elray Mcgregor 10/17/2023, 12:32 PM

## 2023-10-17 NOTE — Progress Notes (Signed)
PHARMACIST - PHYSICIAN COMMUNICATION DR:   Maryfrances Bunnell CONCERNING: Antibiotic IV to Oral Route Change Policy  RECOMMENDATION: This patient is receiving doxycycline by the intravenous route.  Based on criteria approved by the Pharmacy and Therapeutics Committee, the antibiotic(s) is/are being converted to the equivalent oral dose form(s).   DESCRIPTION: These criteria include: Patient being treated for a respiratory tract infection, urinary tract infection, cellulitis or clostridium difficile associated diarrhea if on metronidazole The patient is not neutropenic and does not exhibit a GI malabsorption state The patient is eating (either orally or via tube) and/or has been taking other orally administered medications for a least 24 hours The patient is improving clinically and has a Tmax < 100.5  If you have questions about this conversion, please contact the Pharmacy Department  []   [phone] )  [facility] []   [phone] )  [facility] [x]   [phone] )  [facility] []   [phone] )  [facility] []   [phone] )  [facility]

## 2023-10-17 NOTE — Progress Notes (Signed)
   10/16/23 2218  Vitals  Temp 98 F (36.7 C)  Temp Source Tympanic  BP 110/71  MAP (mmHg) 84  BP Location Left Arm  BP Method Automatic  Patient Position (if appropriate) Lying  Pulse Rate 78  Pulse Rate Source Dinamap  Resp (!) 21  MEWS COLOR  MEWS Score Color Green  Oxygen Therapy  SpO2 96 %  O2 Device Room Air  MEWS Score  MEWS Temp 0  MEWS Systolic 0  MEWS Pulse 0  MEWS RR 1  MEWS LOC 0  MEWS Score 1   Pt arrived to the unit via EMS transport. Pt resting in bed at this time with no complaints. Vitals above. MD aware of pt arrival.

## 2023-10-17 NOTE — Evaluation (Signed)
Occupational Therapy Evaluation Patient Details Name: Jeanne Ortiz MRN: [id number] DOB: 05-10-1941 Today's Date: 10/17/2023   History of Present Illness Pt is an 83 yo female who presented to [facility] from urgent care on 10/16/23 with worsening SOB and cough. Pt with PNA diagnosis through urgent care on 1/22 and in ED on 2/1 found to have persistent PNA, Flu A+, and AKI. PMH: asthma, HTN, HLD, hypothyroidism, OSA   Clinical Impression   At baseline, pt is Independent to Mod I with ADLs and functional mobility with use of SPC for longer distances in the community. At baseline, pt receives assistance from family for cooking, cleaning, and other home management tasks as needed. Pt now presents with decreased activity tolerance and mildly decreased B UE strength affecting functional level. Pt currently demonstrates ability to complete ADLs Independent to Supervision for safety with no physical assist needed and functional mobility/transfers without an with Mod I. Pt requiring 1L to 2L continuous O2 through nasal cannula this session to maintain O2 sat >/92% (additional details below) with activity and at rest. Pt with HR in the upper 60s to upper 70s throughout session. Pt participated well in session and will benefit from acute skilled OT services to address deficits outlined below and for training in energy conservation strategies to increased safety and independence with functional tasks. No post-acute skilled OT services anticipated and no equipment needs identified at this time.       If plan is discharge home, recommend the following: A little help with bathing/dressing/bathroom;Assistance with cooking/housework;Assist for transportation;Help with stairs or ramp for entrance    Functional Status Assessment  Patient has had a recent decline in their functional status and demonstrates the ability to make significant improvements in function in a reasonable and predictable amount of time.  Equipment  Recommendations  None recommended by OT (Pt already has needed equipment)    Recommendations for Other Services       Precautions / Restrictions Precautions Precautions: Fall Restrictions Weight Bearing Restrictions Per Provider Order: No      Mobility Bed Mobility Overal bed mobility: Modified Independent             General bed mobility comments: increased time    Transfers Overall transfer level: Modified independent Equipment used: None                      Balance Overall balance assessment: Needs assistance Sitting-balance support: No upper extremity supported, Feet supported Sitting balance-Leahy Scale: Good     Standing balance support: No upper extremity supported, During functional activity Standing balance-Leahy Scale: Good Standing balance comment: no UE support for ambulation though occasionally using footboard of bed and counter to steady                           ADL either performed or assessed with clinical judgement   ADL Overall ADL's : Needs assistance/impaired Eating/Feeding: Independent;Sitting   Grooming: Modified independent;Standing   Upper Body Bathing: Modified independent;Sitting   Lower Body Bathing: Supervison/ safety;Sit to/from stand   Upper Body Dressing : Modified independent;Sitting   Lower Body Dressing: Modified independent;Supervision/safety;Sit to/from stand   Toilet Transfer: Modified Independent;Ambulation;Regular Toilet (without an AD)   Toileting- Clothing Manipulation and Hygiene: Independent;Sitting/lateral lean       Functional mobility during ADLs: Modified independent (without an AD) General ADL Comments: Pt presents with decreased activity tolerance     Vision Baseline Vision/History: 1 Wears glasses  Ability to See in Adequate Light: 1 Impaired (without glasses) Patient Visual Report: No change from baseline       Perception         Praxis         Pertinent Vitals/Pain  Pain Assessment Pain Assessment: No/denies pain Pain Intervention(s): Monitored during session     Extremity/Trunk Assessment Upper Extremity Assessment Upper Extremity Assessment: Right hand dominant;Overall [facility] for tasks assessed (gross B UE strength 4 to 4+/5)   Lower Extremity Assessment Lower Extremity Assessment: Defer to PT evaluation LLE Deficits / Details: knee extension strength 4+/5 (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 Able to follow multi-step instructions consistently. Pt demonstrates good safety awareness and good insight into deficits.     General Comments  Pt asleep with O2 nasal cannula removed upon O2 arrival with O2 sat at 83% on RA. Pt easy to wake with pt beginning to cough and O2 sat dropping to 74% on RA with coughing. OT instructed pt to donn nasal cannula with pt donning and O2 sat recovering to 88% on 1L continuous O2 through nasal cannula. OT bumped O2 flow rate to 2L with O2 sat recovering and staying stable at >/93%. O2 flow rate turned back down to 1L toward end of session with pt O2 sat remaining >/92% on 1L continuous O2 through nasal cannula. Pt's HR in the upper 60s to upper 70s throughout session.    Exercises     Shoulder Instructions      Home Living Family/patient expects to be discharged to:: Private residence Living Arrangements: Other relatives (grandson and his family, including a preschooler and toddler) Available Help at Discharge: Family Type of Home: House Home Access: Stairs to enter Secretary/administrator of Steps: 4 Entrance Stairs-Rails:  Right;Left Home Layout: One level     Bathroom Shower/Tub: Producer, television/film/video: Handicapped height Bathroom Accessibility: Yes   Home Equipment: Cane - single Librarian, academic (2 wheels);Shower seat          Prior Functioning/Environment Prior Level of Function : Independent/Modified Independent             Mobility Comments: Independent to Mod I, using SPC when going out in community; 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 ADLs Comments: Independent with ADLs; family assists with cooking, cleaning, and other home management tasks PRN        OT Problem List: Decreased activity tolerance      OT Treatment/Interventions: Therapeutic exercise;Therapeutic activities;Patient/family education;Energy conservation    OT Goals(Current goals can be found in the care plan section) Acute Rehab OT Goals Patient Stated Goal: to feel better, return home, and regain her strength and energy OT Goal Formulation: With patient Time For Goal Achievement: 10/31/23 Potential to Achieve Goals: Good ADL Goals Pt/caregiver will Perform Home Exercise Program: Increased strength;Both right and left upper extremity;With theraband;With theraputty;Independently;With written HEP provided (Increased activity tolerance) Additional ADL Goal #1: Patient will demonstrate ability to Independently state 4 energy conservation strategies with handout provided to increase safety and independence with functional tasks.  OT  Frequency: Min 1X/week    Co-evaluation              AM-PAC OT "6 Clicks" Daily Activity     Outcome Measure Help from another person eating meals?: None Help from another person taking care of personal grooming?: None Help from another person toileting, which includes using toliet, bedpan, or urinal?: None Help from another person bathing (including washing, rinsing, drying)?: A Little Help from another person to put on and taking off regular upper body clothing?: None Help from another  person to put on and taking off regular lower body clothing?: A Little 6 Click Score: 22   End of Session Equipment Utilized During Treatment: Oxygen Nurse Communication: Mobility status  Activity Tolerance: Patient tolerated treatment well Patient left: in bed;with call bell/phone within reach  OT Visit Diagnosis: Other (comment) (decreased activity tolerance)                Time: 1610-9604 OT Time Calculation (min): 30 min Charges:  OT General Charges $OT Visit: 1 Visit OT Evaluation $OT Eval Low Complexity: 1 Low OT Treatments $Self Care/Home Management : 8-22 mins  Kelleen Stolze "Orson Eva., OTR/L, MA Acute Rehab [phone]   Lendon Colonel 10/17/2023, 3:32 PM

## 2023-10-17 NOTE — Plan of Care (Signed)

## 2023-10-18 DIAGNOSIS — J09X1 Influenza due to identified novel influenza A virus with pneumonia: Secondary | ICD-10-CM | POA: Diagnosis not present

## 2023-10-18 LAB — COMPREHENSIVE METABOLIC PANEL
ALT: 16 U/L (ref 0–44)
AST: 27 U/L (ref 15–41)
Albumin: 3 g/dL — ABNORMAL LOW (ref 3.5–5.0)
Alkaline Phosphatase: 45 U/L (ref 38–126)
Anion gap: 9 (ref 5–15)
BUN: 37 mg/dL — ABNORMAL HIGH (ref 8–23)
CO2: 23 mmol/L (ref 22–32)
Calcium: 8.6 mg/dL — ABNORMAL LOW (ref 8.9–10.3)
Chloride: 104 mmol/L (ref 98–111)
Creatinine, Ser: 1.25 mg/dL — ABNORMAL HIGH (ref 0.44–1.00)
GFR, Estimated: 43 mL/min — ABNORMAL LOW (ref >60)
Glucose, Bld: 93 mg/dL (ref 70–99)
Potassium: 3.8 mmol/L (ref 3.5–5.1)
Sodium: 136 mmol/L (ref 135–145)
Total Bilirubin: 0.4 mg/dL (ref 0.0–1.2)
Total Protein: 6 g/dL — ABNORMAL LOW (ref 6.5–8.1)

## 2023-10-18 LAB — RESPIRATORY PANEL BY PCR

## 2023-10-18 LAB — CBC
HCT: 36 % (ref 36.0–46.0)
Hemoglobin: 11.9 g/dL — ABNORMAL LOW (ref 12.0–15.0)
MCH: 29.6 pg (ref 26.0–34.0)
MCHC: 33.1 g/dL (ref 30.0–36.0)
MCV: 89.6 fL (ref 80.0–100.0)
Platelets: 143 10*3/uL — ABNORMAL LOW (ref 150–400)
RBC: 4.02 MIL/uL (ref 3.87–5.11)
RDW: 13.2 % (ref 11.5–15.5)
WBC: 7.2 10*3/uL (ref 4.0–10.5)
nRBC: 0 % (ref 0.0–0.2)

## 2023-10-18 MED ORDER — ACETAMINOPHEN 650 MG RE SUPP: 650.0000 mg | Freq: Four times a day (QID) | RECTAL | Status: DC | PRN

## 2023-10-18 MED ORDER — ACETAMINOPHEN 325 MG PO TABS
650.0000 mg | ORAL_TABLET | Freq: Four times a day (QID) | ORAL | Status: DC | PRN
  Administered 2023-10-18 – 2023-10-21 (×3): 650 mg via ORAL
  Filled 2023-10-18 (×4): qty 2

## 2023-10-18 MED ORDER — ADULT MULTIVITAMIN W/MINERALS CH
1.0000 | ORAL_TABLET | Freq: Every day | ORAL | Status: DC
  Administered 2023-10-18 – 2023-10-21 (×4): 1 via ORAL
  Filled 2023-10-18 (×4): qty 1

## 2023-10-18 MED ORDER — LOPERAMIDE HCL 2 MG PO CAPS
2.0000 mg | ORAL_CAPSULE | ORAL | Status: DC | PRN
  Administered 2023-10-18: 2 mg via ORAL
  Filled 2023-10-18: qty 1

## 2023-10-18 NOTE — Hospital Course (Signed)
83 y.o. female with past medical history of morbid obesity, asthma, HTN, HLD, hypothyroidism presented to hospital with lingering cough since December got worse for a week when she went to urgent care.  Patient has given Augmentin Zithromax and prednisone with initial improvement but then again got worse so came to the hospital.  Patient was then out of the hospital for influenza with asthma exacerbation.   Assessment and Plan: Influenza A -Continue Tamiflu - Continue supportive care, flutter and incentive    Asthma exacerbation History of reactive airway disease.  PFTs in 2021 showed FEV1 to FVC ratio 0.76. On Symbicort as an outpatient.  Continue steroids bronchodilator antibiotic.  On 2 L of nasal cannula oxygen.   Hypertension On metoprolol and losartan at home.  Currently on hold due to AKI  Hypothyroidism -Continue levothyroxine   Morbid obesity On semaglutide at home.   Hyponatremia Improved after IV fluids.  Latest sodium of 136  AKI on Chronic kidney disease stage IIIa Creatinine 1.6 on admission from baseline 1.1.  Creatinine down to 1.2 today.  Off IV fluids.   Mood disorder - Continue bupropion, lorazepam, venlafaxine

## 2023-10-18 NOTE — Progress Notes (Signed)
PROGRESS NOTE  Jeanne Ortiz GEX:[id number] DOB: 1940/10/17 DOA: 10/16/2023 PCP: Trisha Mangle, FNP   LOS: 1 day   Brief narrative:  83 y.o. female with past medical history of morbid obesity, asthma, HTN, HLD, hypothyroidism presented to hospital with lingering cough since December got worse for a week when she went to urgent care.  Patient has given Augmentin Zithromax and prednisone with initial improvement but then again got worse so came to the hospital.  Patient was then out of the hospital for influenza with asthma exacerbation.   Assessment and Plan: Influenza A with prolonged illness. -Continue Tamiflu. Continue supportive care, flutter and incentive spirometry.  On Rocephin and doxycycline currently due to prolonged illness..    Asthma exacerbation History of reactive airway disease.  PFTs in 2021 showed FEV1 to FVC ratio 0.76. On Symbicort as an outpatient.  Continue steroids bronchodilator antibiotic.  On 2 L of nasal cannula oxygen.  Not on oxygen at home.  Will wean as able.   Hypertension On metoprolol and losartan at home.  Currently on hold due to AKI  Hypothyroidism -Continue levothyroxine   Morbid obesity Body mass index is 41.95 kg/m.  On semaglutide at home.  Would benefit from weight loss as outpatient.   Hyponatremia Improved after IV fluids.  Latest sodium of 136  AKI on Chronic kidney disease stage IIIa Creatinine 1.6 on admission from baseline 1.1.  Creatinine down to 1.2 today.  Off IV fluids.   Mood disorder - Continue bupropion, lorazepam, venlafaxine     DVT prophylaxis: SCDs Start: 10/16/23 2244   Disposition: Home  likely tomorrow 10/19/23.  Status is: Inpatient Remains inpatient appropriate because: Pending clinical improvement, on supplemental oxygen    Code Status:     Code Status: Full Code  Family Communication: None at bedside  Consultants: None  Procedures: None  Anti-infectives:  Rocephin and  doxycycline  Anti-infectives (From admission, onward)    Start     Dose/Rate Route Frequency Ordered Stop   10/17/23 1600  cefTRIAXone (ROCEPHIN) 1 g in sodium chloride 0.9 % 100 mL IVPB  Status:  Discontinued        1 g 200 mL/hr over 30 Minutes Intravenous Every 24 hours 10/16/23 2246 10/17/23 1025   10/17/23 1600  cefTRIAXone (ROCEPHIN) 2 g in sodium chloride 0.9 % 100 mL IVPB        2 g 200 mL/hr over 30 Minutes Intravenous Every 24 hours 10/17/23 1025     10/17/23 1115  doxycycline (VIBRA-TABS) tablet 100 mg        100 mg Oral Every 12 hours 10/17/23 1025     10/17/23 0000  doxycycline (VIBRAMYCIN) 100 mg in sodium chloride 0.9 % 250 mL IVPB  Status:  Discontinued        100 mg 125 mL/hr over 120 Minutes Intravenous Every 12 hours 10/16/23 2246 10/17/23 1025   10/16/23 2315  oseltamivir (TAMIFLU) capsule 30 mg        30 mg Oral Daily at bedtime 10/16/23 2246 10/21/23 2159   10/16/23 1645  cefTRIAXone (ROCEPHIN) 1 g in sodium chloride 0.9 % 100 mL IVPB        1 g 200 mL/hr over 30 Minutes Intravenous  Once 10/16/23 1631 10/16/23 1729   10/16/23 1645  azithromycin (ZITHROMAX) tablet 500 mg        500 mg Oral  Once 10/16/23 1631 10/16/23 1649        Subjective: Today, patient was seen and examined at  bedside.  Patient states that she feels weak tired and dizzy lightheaded.  Complains of headache.  Feels terrible.  Denies any chest pain, nausea, vomiting, fever, chills or rigor.  Objective: Vitals:   10/18/23 0434 10/18/23 0755  BP: (!) 124/106 139/66  Pulse: 62 (!) 58  Resp: 19 18  Temp:  98 F (36.7 C)  SpO2: 100% 97%    Intake/Output Summary (Last 24 hours) at 10/18/2023 1105 Last data filed at 10/18/2023 0941 Gross per 24 hour  Intake 470 ml  Output --  Net 470 ml   Filed Weights   10/16/23 1459  Weight: 100.7 kg   Body mass index is 41.95 kg/m.   Physical Exam:  GENERAL: Patient is alert awake and Communicative, not in obvious distress.  On nasal cannula  oxygen.  Elderly female, obese. HENT: No scleral pallor or icterus. Pupils equally reactive to light. Oral mucosa is moist NECK: is supple, no gross swelling noted. CHEST: . No crackles or wheezes.  Coarse breath sounds noted. CVS: S1 and S2 heard, no murmur. Regular rate and rhythm.  ABDOMEN: Soft, non-tender, bowel sounds are present. EXTREMITIES: No edema. CNS: Cranial nerves are intact. No focal motor deficits.  Generalized weakness noted. SKIN: warm and dry without rashes.  Data Review: I have personally reviewed the following laboratory data and studies,  CBC: Recent Labs  Lab 10/16/23 1642 10/17/23 0425 10/18/23 0201  WBC 12.0* 5.8 7.2  NEUTROABS 11.2*  --   --   HGB 12.7 10.9* 11.9*  HCT 37.3 32.5* 36.0  MCV 86.9 88.1 89.6  PLT 126* 119* 143*   Basic Metabolic Panel: Recent Labs  Lab 10/16/23 1642 10/16/23 2318 10/17/23 0425 10/18/23 0201  NA 129* 131* 131* 136  K 3.9 3.8 4.0 3.8  CL 98 101 101 104  CO2 21* 18* 22 23  GLUCOSE 117* 155* 154* 93  BUN 33* 31* 32* 37*  CREATININE 1.68* 1.47* 1.37* 1.25*  CALCIUM 8.4* 8.0* 7.9* 8.6*  MG  --  2.3 2.3  --   PHOS  --  3.6 3.8  --    Liver Function Tests: Recent Labs  Lab 10/16/23 2318 10/17/23 0425 10/18/23 0201  AST 26 24 27   ALT 16 14 16   ALKPHOS 38 36* 45  BILITOT 0.6 0.4 0.4  PROT 5.7* 5.4* 6.0*  ALBUMIN 2.9* 2.8* 3.0*   No results for input(s): "LIPASE", "AMYLASE" in the last 168 hours. No results for input(s): "AMMONIA" in the last 168 hours. Cardiac Enzymes: Recent Labs  Lab 10/16/23 2318  CKTOTAL 207   BNP (last 3 results) No results for input(s): "BNP" in the last 8760 hours.  ProBNP (last 3 results) No results for input(s): "PROBNP" in the last 8760 hours.  CBG: Recent Labs  Lab 10/17/23 2107  GLUCAP 141*   Recent Results (from the past 240 hours)  Resp panel by RT-PCR (RSV, Flu A&B, Covid) Anterior Nasal Swab     Status: Abnormal   Collection Time: 10/16/23  4:42 PM   Specimen:  Anterior Nasal Swab  Result Value Ref Range Status   SARS Coronavirus 2 by RT PCR NEGATIVE NEGATIVE Final    Comment: (NOTE) SARS-CoV-2 target nucleic acids are NOT DETECTED.  The SARS-CoV-2 RNA is generally detectable in upper respiratory specimens during the acute phase of infection. The lowest concentration of SARS-CoV-2 viral copies this assay can detect is 138 copies/mL. A negative result does not preclude SARS-Cov-2 infection and should not be used as the sole basis  for treatment or other patient management decisions. A negative result may occur with  improper specimen collection/handling, submission of specimen other than nasopharyngeal swab, presence of viral mutation(s) within the areas targeted by this assay, and inadequate number of viral copies(<138 copies/mL). A negative result must be combined with clinical observations, patient history, and epidemiological information. The expected result is Negative.  Fact Sheet for Patients:  BloggerCourse.com  Fact Sheet for Healthcare Providers:  SeriousBroker.it  This test is no t yet approved or cleared by the [place] FDA and  has been authorized for detection and/or diagnosis of SARS-CoV-2 by FDA under an Emergency Use Authorization (EUA). This EUA will remain  in effect (meaning this test can be used) for the duration of the COVID-19 declaration under Section 564(b)(1) of the Act, 21 U.S.C.section 360bbb-3(b)(1), unless the authorization is terminated  or revoked sooner.       Influenza A by PCR POSITIVE (A) NEGATIVE Final   Influenza B by PCR NEGATIVE NEGATIVE Final    Comment: (NOTE) The Xpert Xpress SARS-CoV-2/FLU/RSV plus assay is intended as an aid in the diagnosis of influenza from Nasopharyngeal swab specimens and should not be used as a sole basis for treatment. Nasal washings and aspirates are unacceptable for Xpert Xpress  SARS-CoV-2/FLU/RSV testing.  Fact Sheet for Patients: BloggerCourse.com  Fact Sheet for Healthcare Providers: SeriousBroker.it  This test is not yet approved or cleared by the [place] FDA and has been authorized for detection and/or diagnosis of SARS-CoV-2 by FDA under an Emergency Use Authorization (EUA). This EUA will remain in effect (meaning this test can be used) for the duration of the COVID-19 declaration under Section 564(b)(1) of the Act, 21 U.S.C. section 360bbb-3(b)(1), unless the authorization is terminated or revoked.     Resp Syncytial Virus by PCR NEGATIVE NEGATIVE Final    Comment: (NOTE) Fact Sheet for Patients: BloggerCourse.com  Fact Sheet for Healthcare Providers: SeriousBroker.it  This test is not yet approved or cleared by the [place] FDA and has been authorized for detection and/or diagnosis of SARS-CoV-2 by FDA under an Emergency Use Authorization (EUA). This EUA will remain in effect (meaning this test can be used) for the duration of the COVID-19 declaration under Section 564(b)(1) of the Act, 21 U.S.C. section 360bbb-3(b)(1), unless the authorization is terminated or revoked.  Performed at [facility], [address], [place], [place] [address]   MRSA Next Gen by PCR, Nasal     Status: None   Collection Time: 10/17/23 12:36 AM   Specimen: Urine, Unspecified Source; Nasal Swab  Result Value Ref Range Status   MRSA by PCR Next Gen NOT DETECTED NOT DETECTED Final    Comment: (NOTE) The GeneXpert MRSA Assay (FDA approved for NASAL specimens only), is one component of a comprehensive MRSA colonization surveillance program. It is not intended to diagnose MRSA infection nor to guide or monitor treatment for MRSA infections. Test performance is not FDA approved in patients less than 75 years old. Performed at [facility], [address] [place], [place] [address]   Expectorated Sputum Assessment w Gram Stain, Rflx to Resp Cult     Status: None   Collection Time: 10/17/23 12:36 AM   Specimen: Sputum  Result Value Ref Range Status   Specimen Description SPUTUM  Final   Special Requests NONE  Final   Sputum evaluation   Final    THIS SPECIMEN IS ACCEPTABLE FOR SPUTUM CULTURE Performed at [facility], [address] [place], [place] [address]    Report Status 10/17/2023 FINAL  Final  Culture, Respiratory w Gram Stain     Status: None (Preliminary result)   Collection Time: 10/17/23 12:36 AM   Specimen: SPU  Result Value Ref Range Status   Specimen Description SPUTUM  Final   Special Requests NONE Reflexed from [phone]  Final   Gram Stain   Final    RARE WBC PRESENT, PREDOMINANTLY PMN FEW GRAM POSITIVE COCCI IN CLUSTERS RARE GRAM NEGATIVE RODS    Culture   Final    CULTURE REINCUBATED FOR BETTER GROWTH Performed at [facility], [address] [place], [place] [address]    Report Status PENDING  Incomplete     Studies: DG Chest 2 View Result Date: 10/16/2023 CLINICAL DATA:  Shortness of breath with worsening cough. EXAM: CHEST - 2 VIEW COMPARISON:  08/26/2022 FINDINGS: Heart size and mediastinal contours are normal. Aortic atherosclerosis and tortuosity. No pleural effusion or interstitial edema. Bronchial wall thickening identified. Mild patchy opacities within the periphery of the right base and left base noted. Visualized osseous structures are notable for a thoracic scoliosis deformity with convexity towards the right. IMPRESSION: Bronchial wall thickening and mild patchy opacities within the periphery of the right base and left base. Findings are concerning for multifocal pneumonia. Electronically Signed   By: Signa Kell M.D.   On: 10/16/2023 15:48      Joycelyn Das, MD  [facility] 10/18/2023  If 7PM-7AM, please contact night-coverage

## 2023-10-18 NOTE — Progress Notes (Signed)
Mobility Specialist Progress Note:    10/18/23 1211  Mobility  Activity Dangled on edge of bed  Level of Assistance Minimal assist, patient does 75% or more  Assistive Device  (HHA)  Activity Response Tolerated well  Mobility Referral Yes  Mobility visit 1 Mobility  Mobility Specialist Start Time (ACUTE ONLY) 1002  Mobility Specialist Stop Time (ACUTE ONLY) 1014  Mobility Specialist Time Calculation (min) (ACUTE ONLY) 12 min   Pt received in bed agreeable to mobility. Pt was able to get to EOB w/ HHA., but once EOB pt had an episode of nausea and vomiting. Pt unable to proceed further today. Requested to stay seated EOB. Call bell and personal belongings in reach. All needs met. RN aware.   Thompson Grayer Mobility Specialist  Please contact vis Secure Chat or  Rehab Office [phone]

## 2023-10-18 NOTE — Progress Notes (Signed)
Initial Nutrition Assessment  DOCUMENTATION CODES:   Morbid obesity  INTERVENTION:  Liberalize diet Multivitamin/minerals   NUTRITION DIAGNOSIS:   Inadequate oral intake related to acute illness as evidenced by meal completion < 25%.    GOAL:   Patient will meet greater than or equal to 90% of their needs    MONITOR:   PO intake  REASON FOR ASSESSMENT:   Consult Assessment of nutrition requirement/status  ASSESSMENT:   83 y.o. F, presented to ED with complaints of worsening shortness of breath and cough. Reported recent dx of pneumonia(1/22) treated with anabiotics, with early on noted improvement although over time started to worsen again. Admitting with Influenza A with pneumonia, AKI, Hyponatremia. PMH; Asthma, HTN, HLD, hypothyroidism, sleep apnea. Patient laying on side and not feeling well at time of visit.  She did not want to talk or discus nutrition at this time, RN in room with RD, Patient complained of N/V/D. Was unsure if she had spoke with MD as of this time.  There is no benefit to excessive dietary restrictions related advanced age, increased nutrient needs. Patient would benefit better from liberalized diet to help meet increased nutrient needs and promote better oral intake.  Admit weight: 100.7 kg Weight history:  10/18/23 101.3 kg  02/15/23 100.7 kg  02/10/23 100.7 kg  12/15/22 102.9 kg       Average Meal Intake: No current documentation  Nutritionally Relevant Medications: Scheduled Meds:  doxycycline  100 mg Oral Q12H   guaiFENesin  600 mg Oral BID   LORazepam  0.5 mg Oral QHS   oseltamivir  30 mg Oral QHS   venlafaxine  25 mg Oral Daily    Labs Reviewed: CBG ranges from 154-93 mg/dL over the last 24 hours     NUTRITION - FOCUSED PHYSICAL EXAM:  Flowsheet Row Most Recent Value  Orbital Region No depletion  Upper Arm Region No depletion  Thoracic and Lumbar Region No depletion  Buccal Region No depletion  Temple Region No  depletion  Clavicle Bone Region No depletion  Clavicle and Acromion Bone Region No depletion  Scapular Bone Region No depletion  Dorsal Hand No depletion  Patellar Region No depletion  Anterior Thigh Region No depletion  Posterior Calf Region No depletion  Edema (RD Assessment) None  Hair Reviewed  Eyes Reviewed  Mouth Reviewed  Skin Reviewed  Nails Reviewed       Diet Order:   Diet Order             Diet Heart Room service appropriate? Yes; Fluid consistency: Thin  Diet effective now                   EDUCATION NEEDS:   Not appropriate for education at this time  Skin:  Skin Assessment: Reviewed RN Assessment  Last BM:  10/17/23 type 7  Height:   Ht Readings from Last 1 Encounters:  10/16/23 5\' 1"  (1.549 m)    Weight:   Wt Readings from Last 1 Encounters:  10/18/23 101.3 kg    Ideal Body Weight:     BMI:  Body mass index is 42.2 kg/m.  Estimated Nutritional Needs:   Kcal:  1500-1750 kcal  Protein:  65-80 g  Fluid:  68ml/kcal    Jamelle Haring RDN, LDN Clinical Dietitian   If unable to reach, please contact "RD Inpatient" secure chat group between 8 am-4 pm daily"

## 2023-10-18 NOTE — Progress Notes (Signed)
OT Cancellation Note  Patient Details Name: Jeanne Ortiz MRN: [id number] DOB: Jul 08, 1941   Cancelled Treatment:    Reason Eval/Treat Not Completed: Fatigue/lethargy limiting ability to participate. Pt not feeling well and declined therapy for today. Will attempt to see pt tomorrow as able.  Hope Budds 10/18/2023, 9:50 AM

## 2023-10-18 NOTE — TOC CM/SW Note (Signed)
Transition of Care [facility]) - Inpatient Brief Assessment   Patient Details  Name: Jeanne Ortiz MRN: [id number] Date of Birth: 1941/02/25  Transition of Care [facility]) CM/SW Contact:    Harriet Masson, RN Phone Number: 10/18/2023, 11:55 AM   Clinical Narrative:  Patient admitted for Flu. Currently on 2L 02. No TOC needs at this time.   Transition of Care Asessment: Insurance and Status: Insurance coverage has been reviewed Patient has primary care physician: Yes Home environment has been reviewed: safe to discharge home when medically stable Prior level of function:: independent Prior/Current Home Services: No current home services Social Drivers of Health Review: SDOH reviewed no interventions necessary Readmission risk has been reviewed: Yes Transition of care needs: no transition of care needs at this time

## 2023-10-19 DIAGNOSIS — J09X1 Influenza due to identified novel influenza A virus with pneumonia: Secondary | ICD-10-CM | POA: Diagnosis not present

## 2023-10-19 LAB — CULTURE, RESPIRATORY W GRAM STAIN: Culture: NORMAL

## 2023-10-19 LAB — LEGIONELLA PNEUMOPHILA SEROGP 1 UR AG: L. pneumophila Serogp 1 Ur Ag: NEGATIVE

## 2023-10-19 NOTE — Progress Notes (Signed)
Mobility Specialist Progress Note:   10/19/23 1222  Mobility  Activity Refused mobility   Pt refused mobility nausea. Will f/u as able.   Leory Plowman  Mobility Specialist Please contact via [employer] office at [phone]

## 2023-10-19 NOTE — Plan of Care (Signed)

## 2023-10-19 NOTE — Progress Notes (Addendum)
 PROGRESS NOTE  Jeanne Ortiz FMW:[id number] DOB: December 25, 1940 DOA: 10/16/2023 PCP: Jesus Elberta Gainer, FNP   LOS: 2 days   Brief narrative:  83 y.o. female with past medical history of morbid obesity, asthma, HTN, HLD, hypothyroidism presented to hospital with lingering cough since December got worse for a week when she went to urgent care.  Patient has given Augmentin  Zithromax  and prednisone  with initial improvement but then again got worse so came to the hospital.  Patient was then out of the hospital for influenza with asthma exacerbation.   Assessment and Plan: Influenza A with prolonged illness. -Continue Tamiflu . Continue supportive care, flutter and incentive spirometry.  On Rocephin  and doxycycline , currently due to prolonged illness.    Asthma exacerbation History of reactive airway disease.  PFTs in 2021 showed FEV1 to FVC ratio 0.76. On Symbicort  as an outpatient.  Continue steroids, bronchodilator, antibiotic.  Currently has been weaned down to room air.  Not feeling too well.  Not on oxygen at home.     Hypertension On metoprolol  and losartan  at home.  Currently losartan  on hold due to AKI  Hypothyroidism -Continue levothyroxine    Morbid obesity Body mass index is 42.2 kg/m.  On semaglutide at home.  Would benefit from weight loss as outpatient.   Hyponatremia Improved after IV fluids.  Latest sodium of 136  AKI on Chronic kidney disease stage IIIa Creatinine 1.6 on admission from baseline 1.1.  Creatinine down to 1.2 today.  Off IV fluids.   Mood disorder - Continue bupropion , lorazepam , venlafaxine      DVT prophylaxis: SCDs Start: 10/16/23 2244   Disposition: Home  likely tomorrow 10/20/23.  Status is: Inpatient Remains inpatient appropriate because: Pending clinical improvement,     Code Status:     Code Status: Full Code  Family Communication: Spoke with the patient's grand son on the phone.  Also spoke with the patient's  daughter.  Consultants: None  Procedures: None  Anti-infectives:  Rocephin  and doxycycline   Anti-infectives (From admission, onward)    Start     Dose/Rate Route Frequency Ordered Stop   10/17/23 1600  cefTRIAXone  (ROCEPHIN ) 1 g in sodium chloride  0.9 % 100 mL IVPB  Status:  Discontinued        1 g 200 mL/hr over 30 Minutes Intravenous Every 24 hours 10/16/23 2246 10/17/23 1025   10/17/23 1600  cefTRIAXone  (ROCEPHIN ) 2 g in sodium chloride  0.9 % 100 mL IVPB        2 g 200 mL/hr over 30 Minutes Intravenous Every 24 hours 10/17/23 1025     10/17/23 1115  doxycycline  (VIBRA -TABS) tablet 100 mg        100 mg Oral Every 12 hours 10/17/23 1025     10/17/23 0000  doxycycline  (VIBRAMYCIN ) 100 mg in sodium chloride  0.9 % 250 mL IVPB  Status:  Discontinued        100 mg 125 mL/hr over 120 Minutes Intravenous Every 12 hours 10/16/23 2246 10/17/23 1025   10/16/23 2315  oseltamivir  (TAMIFLU ) capsule 30 mg        30 mg Oral Daily at bedtime 10/16/23 2246 10/21/23 2159   10/16/23 1645  cefTRIAXone  (ROCEPHIN ) 1 g in sodium chloride  0.9 % 100 mL IVPB        1 g 200 mL/hr over 30 Minutes Intravenous  Once 10/16/23 1631 10/16/23 1729   10/16/23 1645  azithromycin  (ZITHROMAX ) tablet 500 mg        500 mg Oral  Once 10/16/23 1631 10/16/23 1649  Subjective:  Today, patient was seen and examined at bedside.  Patient states that she does not feel well and has nausea headache dizziness.  Tylenol  seems to help with headache.  Fatigued and weak.  Still has some wheezing and cough  Objective: Vitals:   10/19/23 0453 10/19/23 0754  BP:  (!) 155/58  Pulse:  (!) 54  Resp:  18  Temp: (!) 97.4 F (36.3 C) 98 F (36.7 C)  SpO2:  93%   No intake or output data in the 24 hours ending 10/19/23 1140  Filed Weights   10/16/23 1459 10/18/23 1340  Weight: 100.7 kg 101.3 kg   Body mass index is 42.2 kg/m.   Physical Exam:  GENERAL: Patient is alert awake and Communicative, not in obvious  distress.  On nasal cannula oxygen.  Elderly female, obese.  Appears mildly anxious, HENT: No scleral pallor or icterus. Pupils equally reactive to light. Oral mucosa is moist NECK: is supple, no gross swelling noted. CHEST: .  Coarse breath sounds noted with mild wheezing. CVS: S1 and S2 heard, no murmur. Regular rate and rhythm.  ABDOMEN: Soft, non-tender, bowel sounds are present. EXTREMITIES: No edema. CNS: Cranial nerves are intact. No focal motor deficits.  Generalized weakness noted. SKIN: warm and dry without rashes.  Data Review: I have personally reviewed the following laboratory data and studies,  CBC: Recent Labs  Lab 10/16/23 1642 10/17/23 0425 10/18/23 0201  WBC 12.0* 5.8 7.2  NEUTROABS 11.2*  --   --   HGB 12.7 10.9* 11.9*  HCT 37.3 32.5* 36.0  MCV 86.9 88.1 89.6  PLT 126* 119* 143*   Basic Metabolic Panel: Recent Labs  Lab 10/16/23 1642 10/16/23 2318 10/17/23 0425 10/18/23 0201  NA 129* 131* 131* 136  K 3.9 3.8 4.0 3.8  CL 98 101 101 104  CO2 21* 18* 22 23  GLUCOSE 117* 155* 154* 93  BUN 33* 31* 32* 37*  CREATININE 1.68* 1.47* 1.37* 1.25*  CALCIUM 8.4* 8.0* 7.9* 8.6*  MG  --  2.3 2.3  --   PHOS  --  3.6 3.8  --    Liver Function Tests: Recent Labs  Lab 10/16/23 2318 10/17/23 0425 10/18/23 0201  AST 26 24 27   ALT 16 14 16   ALKPHOS 38 36* 45  BILITOT 0.6 0.4 0.4  PROT 5.7* 5.4* 6.0*  ALBUMIN 2.9* 2.8* 3.0*   No results for input(s): LIPASE, AMYLASE in the last 168 hours. No results for input(s): AMMONIA in the last 168 hours. Cardiac Enzymes: Recent Labs  Lab 10/16/23 2318  CKTOTAL 207   BNP (last 3 results) No results for input(s): BNP in the last 8760 hours.  ProBNP (last 3 results) No results for input(s): PROBNP in the last 8760 hours.  CBG: Recent Labs  Lab 10/17/23 2107  GLUCAP 141*   Recent Results (from the past 240 hours)  Resp panel by RT-PCR (RSV, Flu A&B, Covid) Anterior Nasal Swab     Status: Abnormal    Collection Time: 10/16/23  4:42 PM   Specimen: Anterior Nasal Swab  Result Value Ref Range Status   SARS Coronavirus 2 by RT PCR NEGATIVE NEGATIVE Final    Comment: (NOTE) SARS-CoV-2 target nucleic acids are NOT DETECTED.  The SARS-CoV-2 RNA is generally detectable in upper respiratory specimens during the acute phase of infection. The lowest concentration of SARS-CoV-2 viral copies this assay can detect is 138 copies/mL. A negative result does not preclude SARS-Cov-2 infection and should not be used  as the sole basis for treatment or other patient management decisions. A negative result may occur with  improper specimen collection/handling, submission of specimen other than nasopharyngeal swab, presence of viral mutation(s) within the areas targeted by this assay, and inadequate number of viral copies(<138 copies/mL). A negative result must be combined with clinical observations, patient history, and epidemiological information. The expected result is Negative.  Fact Sheet for Patients:  bloggercourse.com  Fact Sheet for Healthcare Providers:  seriousbroker.it  This test is no t yet approved or cleared by the [place]  FDA and  has been authorized for detection and/or diagnosis of SARS-CoV-2 by FDA under an Emergency Use Authorization (EUA). This EUA will remain  in effect (meaning this test can be used) for the duration of the COVID-19 declaration under Section 564(b)(1) of the Act, 21 U.S.C.section 360bbb-3(b)(1), unless the authorization is terminated  or revoked sooner.       Influenza A by PCR POSITIVE (A) NEGATIVE Final   Influenza B by PCR NEGATIVE NEGATIVE Final    Comment: (NOTE) The Xpert Xpress SARS-CoV-2/FLU/RSV plus assay is intended as an aid in the diagnosis of influenza from Nasopharyngeal swab specimens and should not be used as a sole basis for treatment. Nasal washings and aspirates are unacceptable for  Xpert Xpress SARS-CoV-2/FLU/RSV testing.  Fact Sheet for Patients: bloggercourse.com  Fact Sheet for Healthcare Providers: seriousbroker.it  This test is not yet approved or cleared by the [place]  FDA and has been authorized for detection and/or diagnosis of SARS-CoV-2 by FDA under an Emergency Use Authorization (EUA). This EUA will remain in effect (meaning this test can be used) for the duration of the COVID-19 declaration under Section 564(b)(1) of the Act, 21 U.S.C. section 360bbb-3(b)(1), unless the authorization is terminated or revoked.     Resp Syncytial Virus by PCR NEGATIVE NEGATIVE Final    Comment: (NOTE) Fact Sheet for Patients: bloggercourse.com  Fact Sheet for Healthcare Providers: seriousbroker.it  This test is not yet approved or cleared by the [place]  FDA and has been authorized for detection and/or diagnosis of SARS-CoV-2 by FDA under an Emergency Use Authorization (EUA). This EUA will remain in effect (meaning this test can be used) for the duration of the COVID-19 declaration under Section 564(b)(1) of the Act, 21 U.S.C. section 360bbb-3(b)(1), unless the authorization is terminated or revoked.  Performed at [facility], [address], [place], [place] [address]   MRSA Next Gen by PCR, Nasal     Status: None   Collection Time: 10/17/23 12:36 AM   Specimen: Urine, Unspecified Source; Nasal Swab  Result Value Ref Range Status   MRSA by PCR Next Gen NOT DETECTED NOT DETECTED Final    Comment: (NOTE) The GeneXpert MRSA Assay (FDA approved for NASAL specimens only), is one component of a comprehensive MRSA colonization surveillance program. It is not intended to diagnose MRSA infection nor to guide or monitor treatment for MRSA infections. Test performance is not FDA approved in patients less than 31 years old. Performed at  [facility], [address], [place], [place] [address]   Expectorated Sputum Assessment w Gram Stain, Rflx to Resp Cult     Status: None   Collection Time: 10/17/23 12:36 AM   Specimen: Sputum  Result Value Ref Range Status   Specimen Description SPUTUM  Final   Special Requests NONE  Final   Sputum evaluation   Final    THIS SPECIMEN IS ACCEPTABLE FOR SPUTUM CULTURE Performed at [facility], [address], [place], [place] [address]    Report Status 10/17/2023 FINAL  Final  Culture, Respiratory w Gram Stain     Status: None   Collection Time: 10/17/23 12:36 AM   Specimen: SPU  Result Value Ref Range Status   Specimen Description SPUTUM  Final   Special Requests NONE Reflexed from [id number]  Final   Gram Stain   Final    RARE WBC PRESENT, PREDOMINANTLY PMN FEW GRAM POSITIVE COCCI IN CLUSTERS RARE GRAM NEGATIVE RODS    Culture   Final    FEW Normal respiratory flora-no Staph aureus or Pseudomonas seen Performed at [facility], [address], [place], [place] [address]    Report Status 10/19/2023 FINAL  Final  Respiratory (~20 pathogens) panel by PCR     Status: Abnormal   Collection Time: 10/18/23  3:03 PM   Specimen: Nasopharyngeal Swab; Respiratory  Result Value Ref Range Status   Adenovirus NOT DETECTED NOT DETECTED Final   Coronavirus 229E NOT DETECTED NOT DETECTED Final    Comment: (NOTE) The Coronavirus on the Respiratory Panel, DOES NOT test for the novel  Coronavirus (2019 nCoV)    Coronavirus HKU1 NOT DETECTED NOT DETECTED Final   Coronavirus NL63 NOT DETECTED NOT DETECTED Final   Coronavirus OC43 NOT DETECTED NOT DETECTED Final   Metapneumovirus NOT DETECTED NOT DETECTED Final   Rhinovirus / Enterovirus NOT DETECTED NOT DETECTED Final   Influenza A H3 DETECTED (A) NOT DETECTED Final   Influenza B NOT DETECTED NOT DETECTED Final   Parainfluenza Virus 1 NOT DETECTED NOT DETECTED Final   Parainfluenza Virus 2 NOT DETECTED NOT DETECTED Final    Parainfluenza Virus 3 NOT DETECTED NOT DETECTED Final   Parainfluenza Virus 4 NOT DETECTED NOT DETECTED Final   Respiratory Syncytial Virus NOT DETECTED NOT DETECTED Final   Bordetella pertussis NOT DETECTED NOT DETECTED Final   Bordetella Parapertussis NOT DETECTED NOT DETECTED Final   Chlamydophila pneumoniae NOT DETECTED NOT DETECTED Final   Mycoplasma pneumoniae NOT DETECTED NOT DETECTED Final    Comment: Performed at [facility], [address] [place], [place] [address]     Studies: No results found.     Selinda Korzeniewski, MD  [facility] 10/19/2023  If 7PM-7AM, please contact night-coverage

## 2023-10-19 NOTE — Progress Notes (Addendum)
 Occupational Therapy Treatment/Discharge Patient Details Name: Jeanne Ortiz MRN: [id number] DOB: October 19, 1940 Today's Date: 10/19/2023   History of present illness Pt is an 83 yo female who presented to [facility] from urgent care on 10/16/23 with worsening SOB and cough. Pt with PNA diagnosis through urgent care on 1/22 and in ED on 2/1 found to have persistent PNA, Flu A+, and AKI. PMH: asthma, HTN, HLD, hypothyroidism, OSA   OT comments  Pt c/o SOB and pain with coughing, feeling tired, no appetite, has not eaten in over 24 hours per Pt. Pt able to complete ADLs around room ambulating as needed at mod I level for increased time and rest breaks. Pt desats to 88% with activity, quickly improves to 95% on RA when resting. Pt able to complete toileting independently and carry items around room as needed. Pt displays limited activity tolerance with coughing/wheezing, but able to complete basic ADLs as needed. Pt educated on energy conservation with OOB activity, educated on BUE HEP. No further acute OT needs, no follow up needed      If plan is discharge home, recommend the following:  A little help with bathing/dressing/bathroom;Assistance with cooking/housework;Assist for transportation;Help with stairs or ramp for entrance   Equipment Recommendations  None recommended by OT    Recommendations for Other Services      Precautions / Restrictions Precautions Precautions: Fall Restrictions Weight Bearing Restrictions Per Provider Order: No       Mobility Bed Mobility Overal bed mobility: Modified Independent                  Transfers Overall transfer level: Modified independent                       Balance Overall balance assessment: Modified Independent                                         ADL either performed or assessed with clinical judgement   ADL Overall ADL's : Modified independent                                        General ADL Comments: mod I, able to ambulate to toilet as needed throughout the day, does desat to 88% with activity, quickly improves on RA to 95%. Pt on 2L O2 via [place] for comfort    Extremity/Trunk Assessment              Vision       Perception     Praxis      Cognition Arousal: Alert Behavior During Therapy: WFL for tasks assessed/performed Overall Cognitive Status: Within Functional Limits for tasks assessed                                          Exercises      Shoulder Instructions       General Comments      Pertinent Vitals/ Pain       Pain Assessment Pain Assessment: Faces Faces Pain Scale: Hurts a little bit Pain Location: L flank when coughing or during supine<>sit Pain Descriptors / Indicators: Aching, Grimacing, Guarding Pain Intervention(s): Monitored during session  Home Living  Prior Functioning/Environment              Frequency  Min 1X/week        Progress Toward Goals  OT Goals(current goals can now be found in the care plan section)  Progress towards OT goals: Progressing toward goals  Acute Rehab OT Goals Patient Stated Goal: to improve congestion OT Goal Formulation: With patient Time For Goal Achievement: 10/31/23 Potential to Achieve Goals: Good ADL Goals Pt/caregiver will Perform Home Exercise Program: Increased strength;Both right and left upper extremity;With theraband;With theraputty;Independently;With written HEP provided Additional ADL Goal #1: Patient will demonstrate ability to Independently state 4 energy conservation strategies with handout provided to increase safety and independence with functional tasks.  Plan      Co-evaluation                 AM-PAC OT 6 Clicks Daily Activity     Outcome Measure   Help from another person eating meals?: None Help from another person taking care of personal grooming?: None Help  from another person toileting, which includes using toliet, bedpan, or urinal?: None Help from another person bathing (including washing, rinsing, drying)?: None Help from another person to put on and taking off regular upper body clothing?: None Help from another person to put on and taking off regular lower body clothing?: None 6 Click Score: 24    End of Session Equipment Utilized During Treatment: Oxygen  OT Visit Diagnosis: Other (comment) (decreased activity tolerance)   Activity Tolerance Patient tolerated treatment well   Patient Left in bed;with call bell/phone within reach   Nurse Communication Mobility status        Time: 8379-8362 OT Time Calculation (min): 17 min  Charges: OT General Charges $OT Visit: 1 Visit OT Treatments $Self Care/Home Management : 8-22 mins  [place], OTR/L   Elouise JONELLE Bott 10/19/2023, 4:44 PM

## 2023-10-20 DIAGNOSIS — J09X1 Influenza due to identified novel influenza A virus with pneumonia: Secondary | ICD-10-CM | POA: Diagnosis not present

## 2023-10-20 MED ORDER — LOPERAMIDE HCL 2 MG PO CAPS: 2.0000 mg | ORAL_CAPSULE | ORAL | 0 refills | Status: DC | PRN

## 2023-10-20 MED ORDER — VENLAFAXINE HCL 25 MG PO TABS
25.0000 mg | ORAL_TABLET | Freq: Every day | ORAL | Status: DC
  Filled 2023-10-20: qty 1

## 2023-10-20 MED ORDER — OSELTAMIVIR PHOSPHATE 30 MG PO CAPS: 30.0000 mg | ORAL_CAPSULE | Freq: Every day | ORAL | 0 refills | Status: AC

## 2023-10-20 MED ORDER — PREDNISONE 20 MG PO TABS: 40.0000 mg | ORAL_TABLET | Freq: Every day | ORAL | 0 refills | Status: AC

## 2023-10-20 MED ORDER — CEFPODOXIME PROXETIL 200 MG PO TABS: 200.0000 mg | ORAL_TABLET | Freq: Two times a day (BID) | ORAL | 0 refills | Status: AC

## 2023-10-20 NOTE — Progress Notes (Signed)
 Mobility Specialist Progress Note:   10/20/23 1101  Mobility  Activity Ambulated with assistance in hallway  Level of Assistance  (MinG)  Assistive Device None  Distance Ambulated (ft) 100 ft  Activity Response Tolerated fair  Mobility Referral Yes  Mobility visit 1 Mobility  Mobility Specialist Start Time (ACUTE ONLY) 1022  Mobility Specialist Stop Time (ACUTE ONLY) 1035  Mobility Specialist Time Calculation (min) (ACUTE ONLY) 13 min   Pt received in bed, agreeable to mobility. C/o nausea and SOB during ambulation. Pursed lip breathing encouraged. SpO2 in mid 90's on RA. Gait distance limited d/t dizziness. Rated dizziness 8/10. VSS. Pt returned to room with call bell in reach and all needs met. RN notified.    Brown Husband  Mobility Specialist Please contact via [employer] office at [phone]

## 2023-10-20 NOTE — Plan of Care (Signed)

## 2023-10-20 NOTE — Progress Notes (Addendum)
 Mobility Specialist Progress Note:   10/20/23 1418  Mobility  Activity Ambulated with assistance in hallway  Level of Assistance  (MinG)  Assistive Device None  Distance Ambulated (ft) 120 ft  Activity Response Tolerated fair  Mobility Referral Yes  Mobility visit 1 Mobility  Mobility Specialist Start Time (ACUTE ONLY) 1405  Mobility Specialist Stop Time (ACUTE ONLY) 1415  Mobility Specialist Time Calculation (min) (ACUTE ONLY) 10 min   Pt received in bed, agreeable to mobility. Still c/o dizziness and slight SOB. SpO2 in mid 90's on RA. Pt often holding on to handrails in hallways and needing frequent rest breaks d/t fatigue but no unsteadiness present during ambulation. Pt agreeable to sit in chair after session. Pt left in chair with call bell in reach and all needs met. RN notified.    Brown Husband  Mobility Specialist Please contact via [employer] office at [phone]

## 2023-10-20 NOTE — Discharge Summary (Signed)
 Physician Discharge Summary  Jeanne Ortiz FMW:[id number] DOB: Jan 19, 1941 DOA: 10/16/2023  PCP: Jesus Elberta Gainer, FNP  Admit date: 10/16/2023 Discharge date: 10/21/2023  Admitted From: Home  Discharge disposition: Home   Recommendations for Outpatient Follow-Up:   Follow up with your primary care provider in one week.  Check CBC, BMP, magnesium in the next visit   Discharge Diagnosis:   Principal Problem:   Influenza A with pneumonia Active Problems:   Asthma exacerbation   Benign essential hypertension   Hypothyroidism   Obstructive sleep apnea   AKI (acute kidney injury) (HCC)   Hyponatremia   Influenza   Discharge Condition: Improved.  Diet recommendation: Low sodium, heart healthy.   Wound care: Jeanne Ortiz.  Code status: Full.   History of Present Illness:   83 y.o. female with past medical history of morbid obesity, asthma, HTN, hyperlipidemia, hypothyroidism presented to hospital with lingering cough since December got worse for a week when she went to urgent care. Patient has given Augmentin  Zithromax  and prednisone  with initial improvement but then again got worse so came to the hospital. Patient was then admitted to the  hospital for influenza with asthma exacerbation.   Hospital Course:   Following conditions were addressed during hospitalization as listed below,  Influenza A with prolonged illness. -Continue Tamiflu  to complete 5-day course.SABRA  Hospitalization patient received supportive care with for valve incentive spirometry and antibiotics were given empirically if with Rocephin  and doxycycline .  Will continue Vantin  for next couple of days to complete the course of antibiotic.    Asthma exacerbation History of reactive airway disease.  PFTs in 2021 showed FEV1 to FVC ratio 0.76. On Symbicort  as an outpatient.  Continue prednisone , bronchodilator, antibiotic on discharge to complete the course..  On room air.  Not on oxygen at home.  Advised  overexertion.  Hypertension On metoprolol  and losartan  at home.  Resumed on discharge.   Hypothyroidism Will resume levothyroxine  discharge.   Morbid obesity Body mass index is 42.2 kg/m.  On semaglutide at home.  Would benefit from weight loss as outpatient.   Hyponatremia Improved after IV fluids.  Latest sodium of 136   AKI on Chronic kidney disease stage IIIa Creatinine 1.6 on admission from baseline 1.1.  Improved to 1.2.   Mood disorder - Continue bupropion , lorazepam , venlafaxine  on discharge.   Disposition.  At this time, patient is stable for disposition with outpatient PCP follow-up.  Spoke with the patient's daughter on the phone yesterday regarding disposition.  Medical Consultants:   Jeanne Ortiz.  Procedures:    Jeanne Ortiz Subjective:   Today, patient was seen and examined at bedside.  Has mild cough and congestion but otherwise okay.  Feels better than yesterday.  Discharge Exam:   Vitals:   10/21/23 0504 10/21/23 0930  BP: (!) 152/80 (!) 154/78  Pulse: 66 66  Resp: 18 18  Temp: 97.7 F (36.5 C) 98.3 F (36.8 C)  SpO2: 97% 97%   Vitals:   10/20/23 1601 10/20/23 1943 10/21/23 0504 10/21/23 0930  BP: (!) 157/80 (!) 150/72 (!) 152/80 (!) 154/78  Pulse: 68 68 66 66  Resp:  19 18 18   Temp: 98 F (36.7 C) 98.3 F (36.8 C) 97.7 F (36.5 C) 98.3 F (36.8 C)  TempSrc:    Oral  SpO2: 94% 92% 97% 97%  Weight:      Height:        General: Alert awake, not in obvious distress, obese appearing, elderly female not in distress oriented  HENT: pupils equally reacting to light,  No scleral pallor or icterus noted. Oral mucosa is moist.  Chest: Diminished breath sounds bilaterally, coarse breath sounds noted. CVS: S1 &S2 heard. No murmur.  Regular rate and rhythm. Abdomen: Soft, nontender, nondistended.  Bowel sounds are heard.   Extremities: No cyanosis, clubbing or edema.  Peripheral pulses are palpable. Psych: Alert, awake and oriented, normal mood CNS:  No  cranial nerve deficits.  Generalized weakness noted. Skin: Warm and dry.  No rashes noted.  The results of significant diagnostics from this hospitalization (including imaging, microbiology, ancillary and laboratory) are listed below for reference.     Diagnostic Studies:   DG Chest 2 View Result Date: 10/16/2023 CLINICAL DATA:  Shortness of breath with worsening cough. EXAM: CHEST - 2 VIEW COMPARISON:  08/26/2022 FINDINGS: Heart size and mediastinal contours are normal. Aortic atherosclerosis and tortuosity. No pleural effusion or interstitial edema. Bronchial wall thickening identified. Mild patchy opacities within the periphery of the right base and left base noted. Visualized osseous structures are notable for a thoracic scoliosis deformity with convexity towards the right. IMPRESSION: Bronchial wall thickening and mild patchy opacities within the periphery of the right base and left base. Findings are concerning for multifocal pneumonia. Electronically Signed   By: Waddell Calk M.D.   On: 10/16/2023 15:48     Labs:   Basic Metabolic Panel: Recent Labs  Lab 10/16/23 1642 10/16/23 2318 10/17/23 0425 10/18/23 0201  NA 129* 131* 131* 136  K 3.9 3.8 4.0 3.8  CL 98 101 101 104  CO2 21* 18* 22 23  GLUCOSE 117* 155* 154* 93  BUN 33* 31* 32* 37*  CREATININE 1.68* 1.47* 1.37* 1.25*  CALCIUM 8.4* 8.0* 7.9* 8.6*  MG  --  2.3 2.3  --   PHOS  --  3.6 3.8  --    GFR Estimated Creatinine Clearance: 37.9 mL/min (A) (by C-G formula based on SCr of 1.25 mg/dL (H)). Liver Function Tests: Recent Labs  Lab 10/16/23 2318 10/17/23 0425 10/18/23 0201  AST 26 24 27   ALT 16 14 16   ALKPHOS 38 36* 45  BILITOT 0.6 0.4 0.4  PROT 5.7* 5.4* 6.0*  ALBUMIN 2.9* 2.8* 3.0*   No results for input(s): LIPASE, AMYLASE in the last 168 hours. No results for input(s): AMMONIA in the last 168 hours. Coagulation profile No results for input(s): INR, PROTIME in the last 168  hours.  CBC: Recent Labs  Lab 10/16/23 1642 10/17/23 0425 10/18/23 0201  WBC 12.0* 5.8 7.2  NEUTROABS 11.2*  --   --   HGB 12.7 10.9* 11.9*  HCT 37.3 32.5* 36.0  MCV 86.9 88.1 89.6  PLT 126* 119* 143*   Cardiac Enzymes: Recent Labs  Lab 10/16/23 2318  CKTOTAL 207   BNP: Invalid input(s): POCBNP CBG: Recent Labs  Lab 10/17/23 2107  GLUCAP 141*   D-Dimer No results for input(s): DDIMER in the last 72 hours. Hgb A1c No results for input(s): HGBA1C in the last 72 hours. Lipid Profile No results for input(s): CHOL, HDL, LDLCALC, TRIG, CHOLHDL, LDLDIRECT in the last 72 hours. Thyroid function studies No results for input(s): TSH, T4TOTAL, T3FREE, THYROIDAB in the last 72 hours.  Invalid input(s): FREET3 Anemia work up No results for input(s): VITAMINB12, FOLATE, FERRITIN, TIBC, IRON, RETICCTPCT in the last 72 hours. Microbiology Recent Results (from the past 240 hours)  Resp panel by RT-PCR (RSV, Flu A&B, Covid) Anterior Nasal Swab     Status: Abnormal   Collection  Time: 10/16/23  4:42 PM   Specimen: Anterior Nasal Swab  Result Value Ref Range Status   SARS Coronavirus 2 by RT PCR NEGATIVE NEGATIVE Final    Comment: (NOTE) SARS-CoV-2 target nucleic acids are NOT DETECTED.  The SARS-CoV-2 RNA is generally detectable in upper respiratory specimens during the acute phase of infection. The lowest concentration of SARS-CoV-2 viral copies this assay can detect is 138 copies/mL. A negative result does not preclude SARS-Cov-2 infection and should not be used as the sole basis for treatment or other patient management decisions. A negative result may occur with  improper specimen collection/handling, submission of specimen other than nasopharyngeal swab, presence of viral mutation(s) within the areas targeted by this assay, and inadequate number of viral copies(<138 copies/mL). A negative result must be combined with clinical  observations, patient history, and epidemiological information. The expected result is Negative.  Fact Sheet for Patients:  bloggercourse.com  Fact Sheet for Healthcare Providers:  seriousbroker.it  This test is no t yet approved or cleared by the [place]  FDA and  has been authorized for detection and/or diagnosis of SARS-CoV-2 by FDA under an Emergency Use Authorization (EUA). This EUA will remain  in effect (meaning this test can be used) for the duration of the COVID-19 declaration under Section 564(b)(1) of the Act, 21 U.S.C.section 360bbb-3(b)(1), unless the authorization is terminated  or revoked sooner.       Influenza A by PCR POSITIVE (A) NEGATIVE Final   Influenza B by PCR NEGATIVE NEGATIVE Final    Comment: (NOTE) The Xpert Xpress SARS-CoV-2/FLU/RSV plus assay is intended as an aid in the diagnosis of influenza from Nasopharyngeal swab specimens and should not be used as a sole basis for treatment. Nasal washings and aspirates are unacceptable for Xpert Xpress SARS-CoV-2/FLU/RSV testing.  Fact Sheet for Patients: bloggercourse.com  Fact Sheet for Healthcare Providers: seriousbroker.it  This test is not yet approved or cleared by the [place]  FDA and has been authorized for detection and/or diagnosis of SARS-CoV-2 by FDA under an Emergency Use Authorization (EUA). This EUA will remain in effect (meaning this test can be used) for the duration of the COVID-19 declaration under Section 564(b)(1) of the Act, 21 U.S.C. section 360bbb-3(b)(1), unless the authorization is terminated or revoked.     Resp Syncytial Virus by PCR NEGATIVE NEGATIVE Final    Comment: (NOTE) Fact Sheet for Patients: bloggercourse.com  Fact Sheet for Healthcare Providers: seriousbroker.it  This test is not yet approved or cleared  by the [place]  FDA and has been authorized for detection and/or diagnosis of SARS-CoV-2 by FDA under an Emergency Use Authorization (EUA). This EUA will remain in effect (meaning this test can be used) for the duration of the COVID-19 declaration under Section 564(b)(1) of the Act, 21 U.S.C. section 360bbb-3(b)(1), unless the authorization is terminated or revoked.  Performed at [facility], [address] [place], [place] [address]   MRSA Next Gen by PCR, Nasal     Status: Jeanne Ortiz   Collection Time: 10/17/23 12:36 AM   Specimen: Urine, Unspecified Source; Nasal Swab  Result Value Ref Range Status   MRSA by PCR Next Gen NOT DETECTED NOT DETECTED Final    Comment: (NOTE) The GeneXpert MRSA Assay (FDA approved for NASAL specimens only), is one component of a comprehensive MRSA colonization surveillance program. It is not intended to diagnose MRSA infection nor to guide or monitor treatment for MRSA infections. Test performance is not FDA approved in patients less than 2 years  old. Performed at [facility], [address] [place], [place] [address]   Expectorated Sputum Assessment w Gram Stain, Rflx to Resp Cult     Status: Jeanne Ortiz   Collection Time: 10/17/23 12:36 AM   Specimen: Sputum  Result Value Ref Range Status   Specimen Description SPUTUM  Final   Special Requests Jeanne Ortiz  Final   Sputum evaluation   Final    THIS SPECIMEN IS ACCEPTABLE FOR SPUTUM CULTURE Performed at [facility], [address] [place], [place] [address]    Report Status 10/17/2023 FINAL  Final  Culture, Respiratory w Gram Stain     Status: Jeanne Ortiz   Collection Time: 10/17/23 12:36 AM   Specimen: SPU  Result Value Ref Range Status   Specimen Description SPUTUM  Final   Special Requests Jeanne Ortiz Reflexed from [id number]  Final   Gram Stain   Final    RARE WBC PRESENT, PREDOMINANTLY PMN FEW GRAM POSITIVE COCCI IN CLUSTERS RARE GRAM NEGATIVE RODS    Culture   Final    FEW  Normal respiratory flora-no Staph aureus or Pseudomonas seen Performed at [facility], [address] [place], [place] [address]    Report Status 10/19/2023 FINAL  Final  Respiratory (~20 pathogens) panel by PCR     Status: Abnormal   Collection Time: 10/18/23  3:03 PM   Specimen: Nasopharyngeal Swab; Respiratory  Result Value Ref Range Status   Adenovirus NOT DETECTED NOT DETECTED Final   Coronavirus 229E NOT DETECTED NOT DETECTED Final    Comment: (NOTE) The Coronavirus on the Respiratory Panel, DOES NOT test for the novel  Coronavirus (2019 nCoV)    Coronavirus HKU1 NOT DETECTED NOT DETECTED Final   Coronavirus NL63 NOT DETECTED NOT DETECTED Final   Coronavirus OC43 NOT DETECTED NOT DETECTED Final   Metapneumovirus NOT DETECTED NOT DETECTED Final   Rhinovirus / Enterovirus NOT DETECTED NOT DETECTED Final   Influenza A H3 DETECTED (A) NOT DETECTED Final   Influenza B NOT DETECTED NOT DETECTED Final   Parainfluenza Virus 1 NOT DETECTED NOT DETECTED Final   Parainfluenza Virus 2 NOT DETECTED NOT DETECTED Final   Parainfluenza Virus 3 NOT DETECTED NOT DETECTED Final   Parainfluenza Virus 4 NOT DETECTED NOT DETECTED Final   Respiratory Syncytial Virus NOT DETECTED NOT DETECTED Final   Bordetella pertussis NOT DETECTED NOT DETECTED Final   Bordetella Parapertussis NOT DETECTED NOT DETECTED Final   Chlamydophila pneumoniae NOT DETECTED NOT DETECTED Final   Mycoplasma pneumoniae NOT DETECTED NOT DETECTED Final    Comment: Performed at [facility], [address] [place], [place] [address]     Discharge Instructions:   Discharge Instructions     Call MD for:  difficulty breathing, headache or visual disturbances   Complete by: As directed    Call MD for:  temperature >100.4   Complete by: As directed    Diet general   Complete by: As directed    Discharge instructions   Complete by: As directed    Follow-up with your primary care provider in 1 week.  Complete  the course of prednisone , Tamiflu  and antibiotic.  Take Tylenol  for mild pain and headache.  No overexertion.  Increase fluid intake at home. Seek medical attention for worsening symptoms.   Increase activity slowly   Complete by: As directed       Allergies as of 10/21/2023       Reactions   Fentanyl  And Related Nausea  And Vomiting   Severe nausea and vomiting and severe migraine headache.   Morphine And Codeine Nausea And Vomiting   ANY NARCOTIC causes severe nausea and vomiting and severe migraine headache.   Other    brazile and macadamia nuts - migraines   Propofol  Nausea And Vomiting   Sulfa Antibiotics Hives, Swelling        Medication List     TAKE these medications    acetaminophen  500 MG tablet Commonly known as: TYLENOL  Take 1,000 mg by mouth every 6 (six) hours as needed for moderate pain.   albuterol  108 (90 Base) MCG/ACT inhaler Commonly known as: ProAir  HFA Inhale 2 puffs into the lungs every 4 (four) hours as needed.   aluminum-magnesium hydroxide-simethicone 200-200-20 MG/5ML Susp Commonly known as: MAALOX Take 30 mLs by mouth daily as needed (indigestion).   budesonide -formoterol  160-4.5 MCG/ACT inhaler Commonly known as: Symbicort  TAKE 2 PUFFS FIRST THING IN AM AND THEN ANOTHER 2 PUFFS ABOUT 12 HOURS LATER.   buPROPion  150 MG 24 hr tablet Commonly known as: WELLBUTRIN  XL Take 150 mg by mouth daily.   cefpodoxime  200 MG tablet Commonly known as: VANTIN  Take 1 tablet (200 mg total) by mouth 2 (two) times daily for 2 days.   cetirizine-pseudoephedrine 5-120 MG tablet Commonly known as: ZYRTEC-D Take 1 tablet by mouth 2 (two) times daily as needed for allergies.   Co Q-10 300 MG Caps Take 300 mg by mouth daily.   DHEA 50 50 MG Caps Generic drug: Prasterone (DHEA) Take 50 mg by mouth daily.   famotidine  20 MG tablet Commonly known as: Pepcid  One after bfast and one  after supper What changed:  how much to take how to take this when to  take this reasons to take this additional instructions   levothyroxine  112 MCG tablet Commonly known as: SYNTHROID  Take 112 mcg by mouth daily before breakfast.   loperamide  2 MG capsule Commonly known as: IMODIUM  Take 1 capsule (2 mg total) by mouth as needed for diarrhea or loose stools.   LORazepam  0.5 MG tablet Commonly known as: ATIVAN  Take 0.5 mg by mouth at bedtime.   losartan  50 MG tablet Commonly known as: COZAAR  Take 50 mg by mouth daily.   Lutein-Zeaxanthin Tabs Take 1 tablet by mouth daily.   Magnesium 200 MG Tabs Take 200 mg by mouth daily.   metoprolol  tartrate 25 MG tablet Commonly known as: LOPRESSOR  TAKE 1 TABLET BY MOUTH TWICE A DAY What changed:  when to take this reasons to take this   Mucinex  DM Maximum Strength 60-1200 MG Tb12 Take 1 tablet by mouth 2 (two) times daily as needed (congestion).   Omega-3 300 MG Caps Take 300 mg by mouth daily.   oseltamivir  30 MG capsule Commonly known as: TAMIFLU  Take 1 capsule (30 mg total) by mouth at bedtime for 2 days.   oxymetazoline 0.05 % nasal spray Commonly known as: AFRIN Place 1 spray into both nostrils daily as needed for congestion.   Ozempic (0.25 or 0.5 MG/DOSE) 2 MG/1.5ML Sopn Generic drug: Semaglutide(0.25 or 0.5MG /DOS) Inject 0.25 mg into the skin every Friday.   predniSONE  20 MG tablet Commonly known as: DELTASONE  Take 2 tablets (40 mg total) by mouth daily with breakfast for 3 days.   spironolactone  50 MG tablet Commonly known as: ALDACTONE  Take 50 mg by mouth daily.   SUPER B COMPLEX PO Take 1 capsule by mouth daily.   venlafaxine  25 MG tablet Commonly known as: EFFEXOR  Take 25 mg  by mouth daily.   Vitamin D 50 MCG (2000 UT) tablet Take 4,000 Units by mouth daily.   vitamin E 180 MG (400 UNITS) capsule Take 400 Units by mouth daily.         Follow-up Information     Jesus Elberta Gainer, FNP Follow up in 1 week(s).   Specialty: Family Medicine Contact  information: [phone] [place] [address] [phone]                  Time coordinating discharge: 39 minutes  Signed:  Arizona Nordquist  [facility] 10/21/2023, 11:19 AM

## 2023-10-20 NOTE — Progress Notes (Signed)
 PROGRESS NOTE  Jeanne Ortiz FMW:[id number] DOB: 03-08-1941 DOA: 10/16/2023 PCP: Jesus Elberta Gainer, FNP   LOS: 3 days   Brief narrative:  83 y.o. female with past medical history of morbid obesity, asthma, HTN, HLD, hypothyroidism presented to hospital with lingering cough since December got worse for a week when she went to urgent care.  Patient has given Augmentin , Zithromax  and prednisone  with initial improvement but then again got worse so came to the hospital.  Patient was then out of the hospital for influenza with asthma exacerbation.   Assessment and Plan:  Influenza A with prolonged illness. Continue Tamiflu . Continue supportive care, flutter and incentive spirometry.  On Rocephin  and doxycycline , currently due to prolonged illness.  Still with cough congestion and not feeling well.    Asthma exacerbation History of reactive airway disease.  PFTs in 2021 showed FEV1 to FVC ratio 0.76. On Symbicort  as an outpatient.  Continue steroids, bronchodilator, antibiotic.  Has been using nasal cannula intermittently.  Will continue to wean.  Hypertension On metoprolol  and losartan  at home.  Currently losartan  on hold due to AKI.  Will resume on discharge.  Hypothyroidism -Continue levothyroxine    Morbid obesity Body mass index is 42.2 kg/m.  On semaglutide at home.  Would benefit from weight loss as outpatient.   Hyponatremia Improved after IV fluids.  Latest sodium of 136  AKI on Chronic kidney disease stage IIIa Creatinine 1.6 on admission from baseline 1.1.  Creatinine down to 1.2 today.  Off IV fluids.   Mood disorder - Continue bupropion , lorazepam , venlafaxine      DVT prophylaxis: SCDs Start: 10/16/23 2244   Disposition: Home  likely tomorrow 10/21/23.  Patient lives with her grandson and family who have been sick with flu and does not have anybody to help her at home.  Status is: Inpatient Remains inpatient appropriate because: Pending clinical improvement,      Code Status:     Code Status: Full Code  Family Communication: Spoke with the patient's daughter Ms. Maria on the phone and updated her about the clinical condition of the patient.  Consultants: None  Procedures: None  Anti-infectives:  Rocephin  and doxycycline  Tamiflu     Subjective:  Today, patient was seen and examined at bedside.  Patient states that she still has some cough congestion does not feel completely well.  Lives with her grandson and family have been sick with flu and nobody to take care of her at home.  Objective: Vitals:   10/20/23 0540 10/20/23 0758  BP: (!) 154/72 (!) 148/68  Pulse: 63 66  Resp: 19 18  Temp: (!) 97.5 F (36.4 C) 98 F (36.7 C)  SpO2: 96% 100%   No intake or output data in the 24 hours ending 10/20/23 1310  Filed Weights   10/16/23 1459 10/18/23 1340  Weight: 100.7 kg 101.3 kg   Body mass index is 42.2 kg/m.   Physical Exam:  GENERAL: Patient is alert awake and Communicative, not in obvious distress.  On nasal cannula oxygen.  Elderly female, obese.   HENT: No scleral pallor or icterus. Pupils equally reactive to light. Oral mucosa is moist NECK: is supple, no gross swelling noted. CHEST: .  Coarse breath sounds noted, decreased breath sounds bilaterally. CVS: S1 and S2 heard, no murmur. Regular rate and rhythm.  ABDOMEN: Soft, non-tender, bowel sounds are present. EXTREMITIES: No edema. CNS: Cranial nerves are intact. No focal motor deficits.  Generalized weakness noted. SKIN: warm and dry without rashes.  Data Review:  I have personally reviewed the following laboratory data and studies,  CBC: Recent Labs  Lab 10/16/23 1642 10/17/23 0425 10/18/23 0201  WBC 12.0* 5.8 7.2  NEUTROABS 11.2*  --   --   HGB 12.7 10.9* 11.9*  HCT 37.3 32.5* 36.0  MCV 86.9 88.1 89.6  PLT 126* 119* 143*   Basic Metabolic Panel: Recent Labs  Lab 10/16/23 1642 10/16/23 2318 10/17/23 0425 10/18/23 0201  NA 129* 131* 131* 136  K  3.9 3.8 4.0 3.8  CL 98 101 101 104  CO2 21* 18* 22 23  GLUCOSE 117* 155* 154* 93  BUN 33* 31* 32* 37*  CREATININE 1.68* 1.47* 1.37* 1.25*  CALCIUM 8.4* 8.0* 7.9* 8.6*  MG  --  2.3 2.3  --   PHOS  --  3.6 3.8  --    Liver Function Tests: Recent Labs  Lab 10/16/23 2318 10/17/23 0425 10/18/23 0201  AST 26 24 27   ALT 16 14 16   ALKPHOS 38 36* 45  BILITOT 0.6 0.4 0.4  PROT 5.7* 5.4* 6.0*  ALBUMIN 2.9* 2.8* 3.0*   No results for input(s): LIPASE, AMYLASE in the last 168 hours. No results for input(s): AMMONIA in the last 168 hours. Cardiac Enzymes: Recent Labs  Lab 10/16/23 2318  CKTOTAL 207   BNP (last 3 results) No results for input(s): BNP in the last 8760 hours.  ProBNP (last 3 results) No results for input(s): PROBNP in the last 8760 hours.  CBG: Recent Labs  Lab 10/17/23 2107  GLUCAP 141*   Recent Results (from the past 240 hours)  Resp panel by RT-PCR (RSV, Flu A&B, Covid) Anterior Nasal Swab     Status: Abnormal   Collection Time: 10/16/23  4:42 PM   Specimen: Anterior Nasal Swab  Result Value Ref Range Status   SARS Coronavirus 2 by RT PCR NEGATIVE NEGATIVE Final    Comment: (NOTE) SARS-CoV-2 target nucleic acids are NOT DETECTED.  The SARS-CoV-2 RNA is generally detectable in upper respiratory specimens during the acute phase of infection. The lowest concentration of SARS-CoV-2 viral copies this assay can detect is 138 copies/mL. A negative result does not preclude SARS-Cov-2 infection and should not be used as the sole basis for treatment or other patient management decisions. A negative result may occur with  improper specimen collection/handling, submission of specimen other than nasopharyngeal swab, presence of viral mutation(s) within the areas targeted by this assay, and inadequate number of viral copies(<138 copies/mL). A negative result must be combined with clinical observations, patient history, and epidemiological information. The  expected result is Negative.  Fact Sheet for Patients:  bloggercourse.com  Fact Sheet for Healthcare Providers:  seriousbroker.it  This test is no t yet approved or cleared by the [place]  FDA and  has been authorized for detection and/or diagnosis of SARS-CoV-2 by FDA under an Emergency Use Authorization (EUA). This EUA will remain  in effect (meaning this test can be used) for the duration of the COVID-19 declaration under Section 564(b)(1) of the Act, 21 U.S.C.section 360bbb-3(b)(1), unless the authorization is terminated  or revoked sooner.       Influenza A by PCR POSITIVE (A) NEGATIVE Final   Influenza B by PCR NEGATIVE NEGATIVE Final    Comment: (NOTE) The Xpert Xpress SARS-CoV-2/FLU/RSV plus assay is intended as an aid in the diagnosis of influenza from Nasopharyngeal swab specimens and should not be used as a sole basis for treatment. Nasal washings and aspirates are unacceptable for Xpert Xpress SARS-CoV-2/FLU/RSV testing.  Fact Sheet for Patients: bloggercourse.com  Fact Sheet for Healthcare Providers: seriousbroker.it  This test is not yet approved or cleared by the [place]  FDA and has been authorized for detection and/or diagnosis of SARS-CoV-2 by FDA under an Emergency Use Authorization (EUA). This EUA will remain in effect (meaning this test can be used) for the duration of the COVID-19 declaration under Section 564(b)(1) of the Act, 21 U.S.C. section 360bbb-3(b)(1), unless the authorization is terminated or revoked.     Resp Syncytial Virus by PCR NEGATIVE NEGATIVE Final    Comment: (NOTE) Fact Sheet for Patients: bloggercourse.com  Fact Sheet for Healthcare Providers: seriousbroker.it  This test is not yet approved or cleared by the [place]  FDA and has been authorized for detection and/or  diagnosis of SARS-CoV-2 by FDA under an Emergency Use Authorization (EUA). This EUA will remain in effect (meaning this test can be used) for the duration of the COVID-19 declaration under Section 564(b)(1) of the Act, 21 U.S.C. section 360bbb-3(b)(1), unless the authorization is terminated or revoked.  Performed at [facility], [address] [place], [place] [address]   MRSA Next Gen by PCR, Nasal     Status: None   Collection Time: 10/17/23 12:36 AM   Specimen: Urine, Unspecified Source; Nasal Swab  Result Value Ref Range Status   MRSA by PCR Next Gen NOT DETECTED NOT DETECTED Final    Comment: (NOTE) The GeneXpert MRSA Assay (FDA approved for NASAL specimens only), is one component of a comprehensive MRSA colonization surveillance program. It is not intended to diagnose MRSA infection nor to guide or monitor treatment for MRSA infections. Test performance is not FDA approved in patients less than 49 years old. Performed at [facility], [address], [place], [place] [address]   Expectorated Sputum Assessment w Gram Stain, Rflx to Resp Cult     Status: None   Collection Time: 10/17/23 12:36 AM   Specimen: Sputum  Result Value Ref Range Status   Specimen Description SPUTUM  Final   Special Requests NONE  Final   Sputum evaluation   Final    THIS SPECIMEN IS ACCEPTABLE FOR SPUTUM CULTURE Performed at [facility], [address] [place], [place] [address]    Report Status 10/17/2023 FINAL  Final  Culture, Respiratory w Gram Stain     Status: None   Collection Time: 10/17/23 12:36 AM   Specimen: SPU  Result Value Ref Range Status   Specimen Description SPUTUM  Final   Special Requests NONE Reflexed from [id number]  Final   Gram Stain   Final    RARE WBC PRESENT, PREDOMINANTLY PMN FEW GRAM POSITIVE COCCI IN CLUSTERS RARE GRAM NEGATIVE RODS    Culture   Final    FEW Normal respiratory flora-no Staph aureus or Pseudomonas seen Performed  at [facility], [address] [place], [place] [address]    Report Status 10/19/2023 FINAL  Final  Respiratory (~20 pathogens) panel by PCR     Status: Abnormal   Collection Time: 10/18/23  3:03 PM   Specimen: Nasopharyngeal Swab; Respiratory  Result Value Ref Range Status   Adenovirus NOT DETECTED NOT DETECTED Final   Coronavirus 229E NOT DETECTED NOT DETECTED Final    Comment: (NOTE) The Coronavirus on the Respiratory Panel, DOES NOT test for the novel  Coronavirus (2019 nCoV)    Coronavirus HKU1 NOT DETECTED NOT DETECTED Final   Coronavirus NL63 NOT DETECTED NOT DETECTED Final   Coronavirus  OC43 NOT DETECTED NOT DETECTED Final   Metapneumovirus NOT DETECTED NOT DETECTED Final   Rhinovirus / Enterovirus NOT DETECTED NOT DETECTED Final   Influenza A H3 DETECTED (A) NOT DETECTED Final   Influenza B NOT DETECTED NOT DETECTED Final   Parainfluenza Virus 1 NOT DETECTED NOT DETECTED Final   Parainfluenza Virus 2 NOT DETECTED NOT DETECTED Final   Parainfluenza Virus 3 NOT DETECTED NOT DETECTED Final   Parainfluenza Virus 4 NOT DETECTED NOT DETECTED Final   Respiratory Syncytial Virus NOT DETECTED NOT DETECTED Final   Bordetella pertussis NOT DETECTED NOT DETECTED Final   Bordetella Parapertussis NOT DETECTED NOT DETECTED Final   Chlamydophila pneumoniae NOT DETECTED NOT DETECTED Final   Mycoplasma pneumoniae NOT DETECTED NOT DETECTED Final    Comment: Performed at [facility], [address] [place], [place] [address]     Studies: No results found.     Meaghan Whistler, MD  [facility] 10/20/2023  If 7PM-7AM, please contact night-coverage

## 2023-10-21 ENCOUNTER — Telehealth: Payer: Self-pay | Admitting: Internal Medicine

## 2023-10-21 DIAGNOSIS — J09X1 Influenza due to identified novel influenza A virus with pneumonia: Secondary | ICD-10-CM | POA: Diagnosis not present

## 2023-10-21 NOTE — Plan of Care (Signed)
  Problem: Clinical Measurements: Goal: Respiratory complications will improve Outcome: Not Progressing   Problem: Elimination: Goal: Will not experience complications related to bowel motility Outcome: Not Progressing   Problem: Coping: Goal: Level of anxiety will decrease Outcome: Not Progressing   Problem: Pain Managment: Goal: General experience of comfort will improve and/or be controlled Outcome: Not Progressing

## 2023-10-21 NOTE — Telephone Encounter (Signed)
 Patient was in hospital and had a lot of test and blood work done but she does not understand it. She would like an explanation of what was found. Please call and advise.

## 2023-10-21 NOTE — Progress Notes (Signed)
 Pt discharged home with Grandson.

## 2023-10-21 NOTE — TOC Transition Note (Signed)
 Transition of Care [facility]) - Discharge Note   Patient Details  Name: Jeanne Ortiz MRN: [id number] Date of Birth: 01/19/1941  [facility] CM/SW Contact:  Roxie KANDICE Stain, RN Phone Number: 10/21/2023, 12:28 PM   Clinical Narrative:    Patient stable for discharge.  No TOC needs at this time.    Final next level of care: Home/Self Care Barriers to Discharge: Barriers Resolved   Patient Goals and CMS Choice Patient states their goals for this hospitalization and ongoing recovery are:: return home          Discharge Placement             home          Discharge Plan and Services Additional resources added to the After Visit Summary for                                       Social Drivers of Health (SDOH) Interventions SDOH Screenings   Food Insecurity: No Food Insecurity (10/16/2023)  Housing: Low Risk  (10/16/2023)  Transportation Needs: No Transportation Needs (10/16/2023)  Utilities: Not At Risk (10/16/2023)  Financial Resource Strain: Low Risk  (02/09/2022)   [facility] prior to 11/14/2022.  Physical Activity: Unknown (02/09/2022)   [facility] prior to [facility]/14/2022[facility] visits prior to 11/14/2022.  Social Connections: Socially Integrated (10/16/2023)  Stress: Stress Concern Present (02/09/2022)   [facility] prior to 11/14/2022[facility] visits prior to 11/14/2022.  Tobacco Use: Low Risk  (10/16/2023)     Readmission Risk Interventions     No data to display

## 2023-10-22 NOTE — Telephone Encounter (Signed)
 I called and spoke with pt. Pt states she has a f/u with pcp on Thursday. Pt had a chest view and lab work done while she was admitted, but she does not understand the results of this. Pt was told this would be sent to her pulmonologist as well. I informed pt I would have her pulmonologist review this, and I would call her back with the results. Pt verbalized understanding. Routing to Dr Darlean.

## 2023-10-25 NOTE — Telephone Encounter (Signed)
 This cxr is typical of Flu A which we knew she had and needs f/u in a few weeks with another cxr if doing better, but if doing worse will need to be seen sooner for f/u

## 2023-10-26 NOTE — Telephone Encounter (Signed)
Lm x1 for patient.

## 2023-10-28 ENCOUNTER — Encounter: Payer: Self-pay | Admitting: *Deleted

## 2023-10-28 NOTE — Telephone Encounter (Signed)
Pt is active on mychart so sent MW's response via mychart and will close this encounter.

## 2023-10-28 NOTE — Telephone Encounter (Signed)
I called the pt and there was no answer- LMTCB. ?

## 2023-12-08 ENCOUNTER — Encounter: Payer: Self-pay | Admitting: Nurse Practitioner

## 2023-12-08 ENCOUNTER — Ambulatory Visit

## 2023-12-08 ENCOUNTER — Ambulatory Visit (INDEPENDENT_AMBULATORY_CARE_PROVIDER_SITE_OTHER): Payer: Medicare HMO | Admitting: Nurse Practitioner

## 2023-12-08 VITALS — BP 110/82 | HR 80 | Ht 61.0 in | Wt 222.8 lb

## 2023-12-08 DIAGNOSIS — J302 Other seasonal allergic rhinitis: Secondary | ICD-10-CM | POA: Diagnosis not present

## 2023-12-08 DIAGNOSIS — J09X1 Influenza due to identified novel influenza A virus with pneumonia: Secondary | ICD-10-CM

## 2023-12-08 DIAGNOSIS — J45991 Cough variant asthma: Secondary | ICD-10-CM | POA: Diagnosis not present

## 2023-12-08 MED ORDER — FLUTICASONE PROPIONATE 50 MCG/ACT NA SUSP: 2.0000 | Freq: Every day | NASAL | 2 refills | Status: DC

## 2023-12-08 MED ORDER — LEVOCETIRIZINE DIHYDROCHLORIDE 5 MG PO TABS
5.0000 mg | ORAL_TABLET | Freq: Every evening | ORAL | 5 refills | Status: DC
Start: 2023-12-08 — End: 2024-01-24

## 2023-12-08 NOTE — Progress Notes (Unsigned)
 @Patient  ID: Jeanne Ortiz, female    DOB: Jan 18, 1941, 83 y.o.   MRN: [id number]  Chief Complaint  Patient presents with   Follow-up    Coughing and sinus problem. Started a week ago.    Referring provider: Trisha Mangle, *  HPI: 83 year old female, never smoker followed for cough variant asthma, chronic rhinitis. She is a patient of Dr. Thurston Hole and last seen in office 12/15/2022 by Clent Ridges NP. Past medical history significant for HTN, hypothyroid, HLD.   TEST/EVENTS:  05/31/2020 PFT: FVC 97, FEV1 100, ratio 78, TLC 107, DLCOcor 135. No BD 10/16/2023 CXR: bronchial wall thickening and mild patchy opacities within right base and left base, concerning for multifocal pna.   12/15/2022: Sudie Grumbling with Clent Ridges, NP. Surgical risk assessment for total left knee replacement. Followed for asthma. Doing very well breathing wise. No acute symptoms. Uses Symbicort bid. Rarely requires albuterol.   12/08/2023: Today - hospital follow up Discussed the use of AI scribe software for clinical note transcription with the patient, who gave verbal consent to proceed.  History of Present Illness   Jeanne Ortiz is an 83 year old female who presents for follow up following hospital admission for influenza pneumonia 10/16/2023-10/21/2023.  She is feeling much better. Breathing feels like it's getting back to her normal. Her energy levels feel like they're finally starting to improve. Her cough was gone but then she started having more allergy symptoms and is coughing a little bit more now. No purulent sputum, fevers, chills, hemoptysis.   She uses Zyrtec D every other day. Occasionally uses a nasal decongestant at night. She's using her Symbicort. Hasn't needed albuterol recently.   Her ears do feel a little full but no pain or drainage. No hearing changes. No sore throat.       Allergies  Allergen Reactions   Fentanyl And Related Nausea And Vomiting    Severe nausea and vomiting and severe migraine headache.    Morphine And Codeine Nausea And Vomiting    ANY NARCOTIC causes severe nausea and vomiting and severe migraine headache.   Other     brazile and macadamia nuts - migraines    Propofol Nausea And Vomiting   Sulfa Antibiotics Hives and Swelling    Immunization History  Administered Date(s) Administered   Influenza, High Dose Seasonal PF 06/01/2017, 07/11/2018, 07/11/2018, 05/09/2019, 07/10/2020, 06/26/2021   Influenza, Seasonal, Injecte, Preservative Fre 07/30/2014   Influenza-Unspecified 06/26/2009, 06/26/2009, 06/12/2010, 06/12/2010, 05/29/2013, 05/29/2013, 07/30/2014, 07/30/2014, 06/01/2017, 07/11/2018, 07/11/2018, 05/09/2019, 05/09/2019   Moderna Covid-19 Fall Seasonal Vaccine 43yrs & older 05/15/2023   PFIZER Comirnaty(Gray Top)Covid-19 Tri-Sucrose Vaccine 11/20/2019, 12/11/2019, 07/10/2020   PFIZER(Purple Top)SARS-COV-2 Vaccination 11/20/2019, 12/11/2019, 07/10/2020   Pfizer Covid-19 Vaccine Bivalent Booster 71yrs & up 06/26/2021   Pneumococcal Conjugate-13 02/22/2014, 02/22/2014   Pneumococcal Polysaccharide-23 09/15/2003, 09/15/2003, 02/16/2017, 02/16/2017   Td 04/06/2013   Tdap 03/23/2017, 03/23/2017, 01/14/2019   Tetanus 04/06/2013   Zoster Recombinant(Shingrix) 11/15/2018, 11/15/2018, 04/02/2019, 04/02/2019   Zoster, Live 03/28/2014, 03/28/2014, 11/15/2018, 04/02/2019    Past Medical History:  Diagnosis Date   Anemia    Anxiety    Arthritis    Asthma    Chronic kidney disease    Depression    GERD (gastroesophageal reflux disease)    Headache    migraines,  seldom since menopause   Hypertension    Hypothyroidism    Pneumonia    PUD (peptic ulcer disease)    Thyroid disease     Tobacco History: Social History  Tobacco Use  Smoking Status Never  Smokeless Tobacco Never   Counseling given: Not Answered   Outpatient Medications Prior to Visit  Medication Sig Dispense Refill   acetaminophen (TYLENOL) 500 MG tablet Take 1,000 mg by mouth every 6 (six)  hours as needed for moderate pain.     albuterol (PROAIR HFA) 108 (90 Base) MCG/ACT inhaler Inhale 2 puffs into the lungs every 4 (four) hours as needed. 1 each 1   aluminum-magnesium hydroxide-simethicone (MAALOX) 200-200-20 MG/5ML SUSP Take 30 mLs by mouth daily as needed (indigestion).     B Complex-C (SUPER B COMPLEX PO) Take 1 capsule by mouth daily.     budesonide-formoterol (SYMBICORT) 160-4.5 MCG/ACT inhaler TAKE 2 PUFFS FIRST THING IN AM AND THEN ANOTHER 2 PUFFS ABOUT 12 HOURS LATER. 10.2 each 11   buPROPion (WELLBUTRIN XL) 150 MG 24 hr tablet Take 150 mg by mouth daily.     cetirizine-pseudoephedrine (ZYRTEC-D) 5-120 MG tablet Take 1 tablet by mouth 2 (two) times daily as needed for allergies.     Cholecalciferol (VITAMIN D) 50 MCG (2000 UT) tablet Take 4,000 Units by mouth daily.     Coenzyme Q10 (CO Q-10) 300 MG CAPS Take 300 mg by mouth daily.     Dextromethorphan-guaiFENesin (MUCINEX DM MAXIMUM STRENGTH) 60-1200 MG TB12 Take 1 tablet by mouth 2 (two) times daily as needed (congestion).     famotidine (PEPCID) 20 MG tablet One after bfast and one  after supper (Patient taking differently: Take 20 mg by mouth 2 (two) times daily as needed for heartburn.) 60 tablet 5   levothyroxine (SYNTHROID) 112 MCG tablet Take 112 mcg by mouth daily before breakfast.     loperamide (IMODIUM) 2 MG capsule Take 1 capsule (2 mg total) by mouth as needed for diarrhea or loose stools. 30 capsule 0   LORazepam (ATIVAN) 0.5 MG tablet Take 0.5 mg by mouth at bedtime.     losartan (COZAAR) 50 MG tablet Take 50 mg by mouth daily.     Magnesium 200 MG TABS Take 200 mg by mouth daily.     metoprolol tartrate (LOPRESSOR) 25 MG tablet TAKE 1 TABLET BY MOUTH TWICE A DAY (Patient taking differently: Take 25 mg by mouth 2 (two) times daily as needed (if extra high blood pressure).) 180 tablet 1   Omega-3 300 MG CAPS Take 300 mg by mouth daily.     oxymetazoline (AFRIN) 0.05 % nasal spray Place 1 spray into both  nostrils daily as needed for congestion.     Prasterone, DHEA, (DHEA 50) 50 MG CAPS Take 50 mg by mouth daily.     Semaglutide,0.25 or 0.5MG /DOS, (OZEMPIC, 0.25 OR 0.5 MG/DOSE,) 2 MG/1.5ML SOPN Inject 0.25 mg into the skin every Friday.     spironolactone (ALDACTONE) 50 MG tablet Take 50 mg by mouth daily.     venlafaxine (EFFEXOR) 25 MG tablet Take 25 mg by mouth daily.     vitamin E 180 MG (400 UNITS) capsule Take 400 Units by mouth daily.     Multiple Vitamins-Minerals (LUTEIN-ZEAXANTHIN) TABS Take 1 tablet by mouth daily.     No facility-administered medications prior to visit.     Review of Systems:   Constitutional: No weight loss or gain, night sweats, fevers, chills, or lassitude. +fatigue (improving) HEENT: No headaches, difficulty swallowing, tooth/dental problems, or sore throat. No sneezing, itching, ear ache +ear pressure, nasal congestion, post nasal drip CV:  No chest pain, orthopnea, PND, swelling in lower extremities, anasarca, dizziness, palpitations,  syncope Resp: +cough; improved shortness of breath with exertion. No excess mucus or change in color of mucus. No hemoptysis. No wheezing.  No chest wall deformity GI:  No heartburn, indigestion, loss of appetite Skin: No rash, lesions, ulcerations MSK:  No joint pain or swelling.   Neuro: No dizziness or lightheadedness.  Psych: No depression or anxiety. Mood stable.     Physical Exam:  BP 110/82 (BP Location: Left Arm, Patient Position: Sitting, Cuff Size: Large)   Pulse 80   Ht 5\' 1"  (1.549 m)   Wt 222 lb 12.8 oz (101.1 kg)   SpO2 99%   BMI 42.10 kg/m   GEN: Pleasant, interactive, well-appearing; obese; in no acute distress HEENT:  Normocephalic and atraumatic. EACs patent bilaterally. TM pearly gray with dull light reflex bilaterally. PERRLA. Sclera white. Nasal turbinates boggy, moist and patent bilaterally. Clear rhinorrhea present. Oropharynx pink and moist, without exudate or edema. No lesions, ulcerations,  or postnasal drip.  NECK:  Supple w/ fair ROM. No JVD present. Normal carotid impulses w/o bruits. Thyroid symmetrical with no goiter or nodules palpated. No lymphadenopathy.   CV: RRR, no m/r/g, no peripheral edema. Pulses intact, +2 bilaterally. No cyanosis, pallor or clubbing. PULMONARY:  Unlabored, regular breathing. Clear bilaterally A&P w/o wheezes/rales/rhonchi. No accessory muscle use.  GI: BS present and normoactive. Soft, non-tender to palpation. No organomegaly or masses detected.  MSK: No erythema, warmth or tenderness. Cap refil <2 sec all extrem. No deformities or joint swelling noted.  Neuro: A/Ox3. No focal deficits noted.   Skin: Warm, no lesions or rashe Psych: Normal affect and behavior. Judgement and thought content appropriate.     Lab Results:  CBC    Component Value Date/Time   WBC 7.2 10/18/2023 0201   RBC 4.02 10/18/2023 0201   HGB 11.9 (L) 10/18/2023 0201   HCT 36.0 10/18/2023 0201   PLT 143 (L) 10/18/2023 0201   MCV 89.6 10/18/2023 0201   MCH 29.6 10/18/2023 0201   MCHC 33.1 10/18/2023 0201   RDW 13.2 10/18/2023 0201   LYMPHSABS 0.3 (L) 10/16/2023 1642   MONOABS 0.3 10/16/2023 1642   EOSABS 0.1 10/16/2023 1642   BASOSABS 0.0 10/16/2023 1642    BMET    Component Value Date/Time   NA 136 10/18/2023 0201   K 3.8 10/18/2023 0201   CL 104 10/18/2023 0201   CO2 23 10/18/2023 0201   GLUCOSE 93 10/18/2023 0201   BUN 37 (H) 10/18/2023 0201   CREATININE 1.25 (H) 10/18/2023 0201   CALCIUM 8.6 (L) 10/18/2023 0201   GFRNONAA 43 (L) 10/18/2023 0201    BNP No results found for: "BNP"   Imaging:  No results found.  Administration History     None          Latest Ref Rng & Units 05/31/2020    1:55 PM  PFT Results  FVC-Pre L 2.36   FVC-Predicted Pre % 97   FVC-Post L 2.50   FVC-Predicted Post % 103   Pre FEV1/FVC % % 76   Post FEV1/FCV % % 78   FEV1-Pre L 1.80   FEV1-Predicted Pre % 100   FEV1-Post L 1.96   DLCO uncorrected ml/min/mmHg  23.79   DLCO UNC% % 135   DLCO corrected ml/min/mmHg 23.79   DLCO COR %Predicted % 135   DLVA Predicted % 117   TLC L 5.13   TLC % Predicted % 107   RV % Predicted % 109     No results found  for: "NITRICOXIDE"      Assessment & Plan:   No problem-specific Assessment & Plan notes found for this encounter. Assessment and Plan    Allergic Rhinitis with Postnasal Drip Recurrent cough likely due to postnasal drip from allergic rhinitis. Ear fullness likely due to sinus congestion affecting the Eustachian tube. Symptoms are exacerbated by seasonal allergies. - Recommend saline nasal rinses one to two times daily using bottled distilled water. - Prescribe Flonase nasal spray to be used daily, 30 minutes after saline rinses. - Advise alternating between Zyrtec D and Xyzal (levocetirizine) on non-Zyrtec days to manage allergy symptoms. - Caution against daily use of Mucinex nasal spray (nasal decongestants) to avoid rebound congestion; use only as needed for severe symptoms for no longer than 3-5 days consecutively. - Cough control PRN  Asthma Recent exacerbation secondary to influenza A pna. Clinically improved. Recurrent mild cough seems more so upper airway in nature. No bronchospasm on exam. No recent use of albuterol rescue inhaler in the past five to six days. Continues to use Symbicort twice daily for asthma management. Action plan in place - Continue Symbicort twice daily. - Continue PRN albuterol  - Trigger prevention  Influenza and Pneumonia Reports improvement in energy levels following recent hospitalization for influenza and pneumonia. No current fever or chills. Cough had resolved but is now returning, likely related to sinus drainage rather than infection. No wheezing or significant respiratory distress. - Order repeat chest x-ray to ensure resolution of pneumonia.        Advised if symptoms do not improve or worsen, to please contact office for sooner follow up or  seek emergency care.   I spent 45 minutes of dedicated to the care of this patient on the date of this encounter to include pre-visit review of records, face-to-face time with the patient discussing conditions above, post visit ordering of testing, clinical documentation with the electronic health record, making appropriate referrals as documented, and communicating necessary findings to members of the patients care team.  Noemi Chapel, NP 12/08/2023  Pt aware and understands NP's role.

## 2023-12-08 NOTE — Patient Instructions (Addendum)
 Continue Albuterol inhaler 2 puffs every 6 hours as needed for shortness of breath or wheezing. Notify if symptoms persist despite rescue inhaler/neb use.  Continue Symbicort 2 puffs Twice daily. Brush tongue and rinse mouth afterwards Continue Zyrtec D every other day then on the alternate days, take levocetirizine 5 mg once a day Don't use the oxymetazoline nasal spray for longer than 3-5 days  Use saline nasal rinses [employer] Med rinse or [employer]) 1-2 times a day, use bottled distilled water. Follow with flonase nasal spray 2 sprays each nostril 30 minutes after the morning rinse  Chest x ray today  Follow up in 3 months with Dr. Sherene Sires or Philis Nettle. If symptoms do not improve or worsen, please contact office for sooner follow up or seek emergency care.

## 2023-12-10 ENCOUNTER — Encounter: Payer: Self-pay | Admitting: Nurse Practitioner

## 2024-01-23 NOTE — Progress Notes (Unsigned)
 Jeanne Ortiz, female    DOB: May 26, 1941    MRN: [id number]   Brief patient profile:  83   yowf never smoker onset in teenager years in [place] with intermittent non-seasonal rhinitis more problem around age 83 with year round symptoms no better with sinus surgeries / allergy eval pos birch, horses cats/dog never took shots  with new onset cough/wheezing  around 2010 eval by [facility] rx alb/antihistamines then he left for [facility]  and then seen  By [facility]  rec gerd rx didn't help and side effects = crampy  then nucala did not take due to cost and worse cough/sob since arrived in [facility] March 2020 so referred to pulmonary clinic 11/16/2019 by Jeanne Crown  NP.  Living in house built in 1980s with house with crawl space/ 2 cats / golden retriever    History of Present Illness  11/16/2019  Pulmonary/ 1st office eval/Jeanne Ortiz / maint on singulair  and generic advair Chief Complaint  Patient presents with   Pulmonary Consult    Referred by Jeanne Crown, NP. Pt c/o cough for the past 8-10 years.   Dyspnea:  MMRC2 = can't walk a nl pace on a flat grade s sob but does fine slow and flat leaning on cart at HT Cough: dry raspy after supper and hs  Sleep: flat bed 2 pillows  SABA use: daily now but up to 7x per day much less since last prednisone  finished  4 days  prior to OV   Has overt HB but told can't take ppi due to renal dz rec .Plan A = Automatic = Always=    Symbicort  80 Take 2 puffs first thing in am and then another 2 puffs about 12 hours later.  Work on inhaler technique:   Pepcid  20 mg one after bfast and supper  Plan B = Backup (to supplement plan A, not to replace it) Only use your albuterol (poair)  inhaler as a rescue medication Plan C = Crisis (instead of Plan B but only if Plan B stops working) - only use your albuterol  nebulizer if you first try Plan B  Plan D = Deltasone  - if getting worse despite above then take Prednisone  10 mg take  4 each am  x 2 days,   2 each am x 2 days,  1 each am x 2 days and stop     08/13/2022 acute  ov/Jeanne Ortiz re: asthma  maint on symbicort  160   Chief Complaint  Patient presents with   Acute Visit    Pneumonia at Urgent care 1wk. Ago, sob, cough-pale yellow now, no fever  Dyspnea:  was doing fine until acutely worse Jul 03 2022 rx by [facility] with zpak/ pred/ amox and some better but chest still feels tight , better p saba  Cough: very pale now just in am  Sleeping: no resp cc flat bed 2 pillows  SABA use: 2-3 x per day  02: none Rec Plan A = Automatic = Always=    Symbicort  160 or dulera  200 Take 2 puffs first thing in am and then another 2 puffs about 12 hours later.  Plan B = Backup (to supplement plan A, not to replace it) Only use your albuterol  inhaler as a rescue medication  Also  Ok to try albuterol  15 min before an activity (on alternating days)  that you know would usually make you short of breath . Depomedrol 120 mg IM today  For cough > mucinex  dm 1200  mg every 12 hours as needed  Try prilosec otc 20mg   Take 30-60 min before first meal of the day and Pepcid  ac (famotidine ) 20 mg one @  bedtime until cough is completely gone for at least a week without the need for cough suppression GERD diet reviewed, bed blocks rec     08/26/2022  f/u ov/Jeanne Ortiz re: asthma   maint on symbicort  160   Chief Complaint  Patient presents with   Follow-up    Pt states no new issues since LOV. Pt states she feels better since LOV.  Dyspnea:  limited by knee not breathing  Cough: better though could not tol prilosec > mood change Sleeping: flat bed, 2 pillows no resp  SABA use: none 02: none  Covid status:   1st 3 vax Rec No change rx  Cxr 11/18/23 pna resolved    01/24/2024  f/u ov/Jeanne Ortiz re: asthma    maint on generic Symbicort   160 Take 2 puffs first thing in am and then another 2 puffs about 12 hours later.  Chief Complaint  Patient presents with   Follow-up    Allergies are bothering her.  Breathing is a  little better than last visit.  Dyspnea:  limited by back /radicular  L 5 on Left Cough: intermittent green mucus/ rattling x sev weeks "every spring"  Sleeping: flat bed 2 pillows more cough x last  2 nocts  SABA use: once or twice a day        No obvious day to day or daytime variability or assoc excess/ purulent sputum or mucus plugs or hemoptysis or cp or chest tightness, subjective wheeze or overt sinus or hb symptoms.    Also denies any obvious fluctuation of symptoms with weather or environmental changes or other aggravating or alleviating factors except as outlined above   No unusual exposure hx or h/o childhood pna/ asthma or knowledge of premature birth.  Current Allergies, Complete Past Medical History, Past Surgical History, Family History, and Social History were reviewed in [employer] record.  ROS  The following are not active complaints unless bolded Hoarseness, sore throat, dysphagia, dental problems, itching, sneezing,  nasal congestion or discharge of excess mucus or purulent secretions, ear ache,   fever, chills, sweats, unintended wt loss or wt gain, classically pleuritic or exertional cp,  orthopnea pnd or arm/hand swelling  or leg swelling, presyncope, palpitations, abdominal pain, anorexia, nausea, vomiting, diarrhea  or change in bowel habits or change in bladder habits, change in stools or change in urine, dysuria, hematuria,  rash, arthralgias, visual complaints, headache, numbness, weakness or ataxia or problems with walking or coordination,  change in mood or  memory.        Current Meds  Medication Sig   acetaminophen  (TYLENOL ) 500 MG tablet Take 1,000 mg by mouth every 6 (six) hours as needed for moderate pain.   albuterol  (PROAIR  HFA) 108 (90 Base) MCG/ACT inhaler Inhale 2 puffs into the lungs every 4 (four) hours as needed.   aluminum-magnesium hydroxide-simethicone (MAALOX) 200-200-20 MG/5ML SUSP Take 30 mLs by mouth daily as needed  (indigestion).   B Complex-C (SUPER B COMPLEX PO) Take 1 capsule by mouth daily.   budesonide -formoterol  (SYMBICORT ) 160-4.5 MCG/ACT inhaler TAKE 2 PUFFS FIRST THING IN AM AND THEN ANOTHER 2 PUFFS ABOUT 12 HOURS LATER.   buPROPion  (WELLBUTRIN  XL) 150 MG 24 hr tablet Take 150 mg by mouth daily.   cetirizine-pseudoephedrine (ZYRTEC-D) 5-120 MG tablet Take 1 tablet by mouth 2 (  two) times daily as needed for allergies.   Cholecalciferol (VITAMIN D) 50 MCG (2000 UT) tablet Take 4,000 Units by mouth daily.   Coenzyme Q10 (CO Q-10) 300 MG CAPS Take 300 mg by mouth daily.   Dextromethorphan-guaiFENesin  (MUCINEX  DM MAXIMUM STRENGTH) 60-1200 MG TB12 Take 1 tablet by mouth 2 (two) times daily as needed (congestion).   famotidine  (PEPCID ) 20 MG tablet One after bfast and one  after supper (Patient taking differently: Take 20 mg by mouth 2 (two) times daily as needed for heartburn.)   fluticasone  (FLONASE ) 50 MCG/ACT nasal spray Place 2 sprays into both nostrils daily.   levothyroxine  (SYNTHROID ) 112 MCG tablet Take 112 mcg by mouth daily before breakfast.   LORazepam  (ATIVAN ) 0.5 MG tablet Take 0.5 mg by mouth at bedtime.   losartan  (COZAAR ) 50 MG tablet Take 50 mg by mouth daily.   Magnesium 200 MG TABS Take 200 mg by mouth daily.   metoprolol  tartrate (LOPRESSOR ) 25 MG tablet TAKE 1 TABLET BY MOUTH TWICE A DAY (Patient taking differently: Take 25 mg by mouth 2 (two) times daily as needed (if extra high blood pressure).)   Omega-3 300 MG CAPS Take 300 mg by mouth daily.   oxymetazoline (AFRIN) 0.05 % nasal spray Place 1 spray into both nostrils daily as needed for congestion.   Prasterone, DHEA, (DHEA 50) 50 MG CAPS Take 50 mg by mouth daily.   Semaglutide,0.25 or 0.5MG /DOS, (OZEMPIC, 0.25 OR 0.5 MG/DOSE,) 2 MG/1.5ML SOPN Inject 0.25 mg into the skin every Friday.   spironolactone  (ALDACTONE ) 50 MG tablet Take 50 mg by mouth daily.   venlafaxine  (EFFEXOR ) 25 MG tablet Take 25 mg by mouth daily.   vitamin E  180 MG (400 UNITS) capsule Take 400 Units by mouth daily.          Past Medical History:  Diagnosis Date   Asthma    Hypertension    Thyroid disease        Objective:   Wts   01/24/2024       221  08/26/2022     219  08/13/2022     222   09/16/2021         224   06/09/2021      225   05/31/20 213 lb (96.6 kg)  01/26/20 217 lb (98.4 kg)  11/16/19 220 lb (99.8 kg)    Vital signs reviewed  01/24/2024  - Note at rest 02 sats  99% on RA   General appearance:    amb pleasant wf    Amb wf nad    HEENT : Oropharynx  clear      Nasal turbinates nl    NECK :  without  apparent JVD/ palpable Nodes/TM    LUNGS: no acc muscle use,  Nl contour chest with minimal insp/ exp rhonchi  bilaterally without cough on insp or exp maneuvers   CV:  RRR  no s3 or murmur or increase in P2, and no edema   ABD: obese  soft and nontender   MS:  Gait nl   ext warm without deformities Or obvious joint restrictions  calf tenderness, cyanosis or clubbing    SKIN: warm and dry without lesions    NEURO:  alert, approp, nl sensorium with  no motor or cerebellar deficits apparent.      Assessment

## 2024-01-24 ENCOUNTER — Encounter: Payer: Self-pay | Admitting: Internal Medicine

## 2024-01-24 ENCOUNTER — Ambulatory Visit: Admitting: Internal Medicine

## 2024-01-24 VITALS — BP 118/64 | HR 105 | Temp 98.8°F | Ht 61.5 in | Wt 221.8 lb

## 2024-01-24 DIAGNOSIS — J45991 Cough variant asthma: Secondary | ICD-10-CM | POA: Diagnosis not present

## 2024-01-24 MED ORDER — METHYLPREDNISOLONE ACETATE 80 MG/ML IJ SUSP
120.0000 mg | Freq: Once | INTRAMUSCULAR | Status: AC
  Administered 2024-01-24: 120 mg via INTRAMUSCULAR

## 2024-01-24 NOTE — Assessment & Plan Note (Signed)
 Onset around 2010 in pt with allergic rhinitis - allergy testing ? 2010  in [place]  Pos multiple sources including dogs / cat / trees  - 11/16/2019   try symbicort  80 2bid instead of wixella (DPI) > much better - 01/26/2020  After extensive coaching inhaler device,  effectiveness =    90% from baseline 75% - PFT's  05/31/2020  FEV1 1.96 (109 % ) ratio 0.78  p 9 % improvement from saba p symbicort  80 x 2 puffs  prior to study with DLCO  23.79 (135%) corrects to 4.85 (116%)  for alv volume and FV curve min concave   - 09/16/2021  After extensive coaching inhaler device,  effectiveness =    80% > try symbicort  160 2bid x one month to see if any better vs symb 80 > prefers the 160  -  01/24/2024  After extensive coaching inhaler device,  effectiveness =    60% (short ti)   Good control chronically despite marginal hfa technique now with with mild exac assoc with seasonal rhinitis flare > rec Depomedrol 120 mg L buttock where she is having radicular leg pain.    Refer to allergy   F/u here q 3 m, sooner prn          Each maintenance medication was reviewed in detail including emphasizing most importantly the difference between maintenance and prns and under what circumstances the prns are to be triggered using an action plan format where appropriate.  Total time for H and P, chart review, counseling, reviewing hfa  device(s) and generating customized AVS unique to this office visit / same day charting = 30 min

## 2024-01-24 NOTE — Patient Instructions (Addendum)
 Depomedrol 120  mg IM   Work on inhaler technique:  relax and gently blow all the way out then take a nice smooth full deep breath back in, triggering the inhaler at same time you start breathing in.  Hold breath in for at least  5 seconds if you can. Blow out symbicort   thru nose. Rinse and gargle with water  when done.  If mouth or throat bother you at all,  try brushing teeth/gums/tongue with arm and hammer toothpaste/ make a slurry and gargle and spit out.   My office will be contacting you by phone for referral to Allergy   - if you don't hear back from my office within one week please call us  back or notify us  thru MyChart and we'll address it right away.    Please schedule a follow up visit in 3 months but call sooner if needed

## 2024-03-09 ENCOUNTER — Ambulatory Visit: Admitting: Allergy & Immunology

## 2024-03-09 ENCOUNTER — Encounter: Payer: Self-pay | Admitting: Allergy & Immunology

## 2024-03-09 ENCOUNTER — Other Ambulatory Visit: Payer: Self-pay

## 2024-03-09 VITALS — BP 112/70 | HR 100 | Temp 97.1°F | Resp 18 | Ht 60.24 in | Wt 221.4 lb

## 2024-03-09 DIAGNOSIS — J310 Chronic rhinitis: Secondary | ICD-10-CM | POA: Diagnosis not present

## 2024-03-09 DIAGNOSIS — B999 Unspecified infectious disease: Secondary | ICD-10-CM

## 2024-03-09 DIAGNOSIS — J4540 Moderate persistent asthma, uncomplicated: Secondary | ICD-10-CM

## 2024-03-09 NOTE — Progress Notes (Signed)
 NEW PATIENT  Date of Service/Encounter:  03/09/24  Consult requested by: Jesus Elberta Gainer, FNP   Assessment:   Moderate persistent asthma, uncomplicated - with eosinophilic phenotype  Elevated eosinophil count of 300  Chronic rhinitis  Recurrent infections  Singulair  non responder  Plan/Recommendations:   1. Moderate persistent asthma, uncomplicated - Lung testing looked great today. - I think you would benefit from Fasenra, which would decrease your number of times you need steroids and could improve your lung function. [facility] handout provided today.  - Daily controller medication(s): Symbicort  80/4.72mcg two puffs twice daily with spacer - Prior to physical activity: albuterol  2 puffs 10-15 minutes before physical activity. - Rescue medications: albuterol  4 puffs every 4-6 hours as needed and albuterol  nebulizer one vial every 4-6 hours as needed - Asthma control goals:  * Full participation in all desired activities (may need albuterol  before activity) * Albuterol  use two time or less a week on average (not counting use with activity) * Cough interfering with sleep two time or less a month * Oral steroids no more than once a year * No hospitalizations  2. Chronic rhinitis - Because of insurance stipulations, we cannot do skin testing on the same day as your first visit. - We are all working to fight this, but for now we need to do two separate visits.  - We will know more after we do testing at the next visit.  - The skin testing visit can be squeezed in at your convenience.  - Then we can make a more full plan to address all of your symptoms. - Be sure to stop your antihistamines for 3 days before this appointment.  - You can continue to use Nasonex  and Flonase .   3. Recurrent infections - We will obtain some screening labs to evaluate your immune system.  - Labs to evaluate the quantitative [facility]) aspects of your immune system: IgG/IgA/IgM, CBC with  differential - Labs to evaluate the qualitative (HOW WELL THEY WORK) aspects of your immune system: CH50, Pneumococcal titers, Tetanus titers, Diphtheria titers - We may consider immunizations with Pneumovax and Tdap to challenge your immune system, and then obtain repeat titers in 4-6 weeks.   4. Return in about 1 week (around 03/16/2024) for SKIN TESTING (1-55). You can have the follow up appointment with Dr. Iva or a Nurse Practicioner (our Nurse Practitioners are excellent and always have Physician oversight!).    This note in its entirety was forwarded to the Provider who requested this consultation.  Subjective:   Jeanne Ortiz is a 83 y.o. 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  Discussed the use of AI scribe software for clinical note transcription with the patient and/or guardian, who gave verbal consent to proceed.  Sharai K Mccluskey was referred by  Jesus Elberta Gainer, FNP.     Jeanne Ortiz is a 83 y.o. female presenting for an evaluation of asthma and allergies.  Asthma/Respiratory Symptom History: She has been experiencing repeated sinus and bronchial infections for an unspecified duration. Her asthma is managed with albuterol  as a rescue inhaler and nifromoterol as a  maintenance inhaler. She also takes famotidine  after meals. Over the past year, she has received multiple prednisone  shots, with the most recent administered two weeks ago at an urgent care facility due to a severe cough. She estimates having received about four prednisone  shots in the past year, excluding those during a hospital stay in February. She uses her rescue inhaler once in the past week and has experienced severe coughs requiring prednisone  shots. No frequent illnesses outside of respiratory issues.   She follows with Dr. Darlean. She is on albuterol  as needed with Symbicort  80mcg two puffs BID. She is using the generic version of this.   Allergic Rhinitis Symptom History: She has a history of sinus surgeries, with the first one performed in [place] and the second in [place], which provided only temporary relief. She uses Mucinex  nose spray and Flonase  for her sinus issues, and occasionally takes Zyrtec for allergies. Past allergy testing revealed allergies to horses and birch trees.  Her past medical history includes a knee replacement last year, which was complicated by six months of nerve pain. She also reports numbness in her leg due to disc issues, which she manages by lying down frequently. She moved from [place], [place] , to her current location five years ago to assist her family during the COVID-19 pandemic. She has a history of working for [employer] in [employer] and workers' compensation.  Infection Symptom History: In February, she was hospitalized for five days due to a severe illness involving the flu, COVID-19, and pneumonia, despite being vaccinated. This was her third pneumonia since moving to her current location five years ago.   She is on metoprolol  for her hypertension.   Otherwise, there is no history of other atopic diseases, including drug allergies, stinging insect allergies, or contact dermatitis. There is no significant infectious history. Vaccinations are up to  date.    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   Morphine And Codeine Nausea And Vomiting   ANY NARCOTIC causes severe nausea and vomiting and severe migraine headache.   Other    brazile and macadamia nuts - migraines   Propofol  Nausea And Vomiting   Sulfa Antibiotics Hives, Swelling        Medication List        Accurate as of March 09, 2024 12:54 PM. If you have any questions, ask your nurse or doctor.          STOP taking these medications    budesonide -formoterol  160-4.5 MCG/ACT inhaler Commonly known  as: Symbicort  Stopped by: Marty Morton Shaggy       TAKE these medications    acetaminophen  500 MG tablet Commonly known as: TYLENOL  Take 1,000 mg by mouth every 6 (six) hours as needed for moderate pain.   albuterol  108 (90 Base) MCG/ACT inhaler Commonly known as: ProAir  HFA Inhale 2 puffs into the lungs every 4 (four) hours as needed.   aluminum-magnesium hydroxide-simethicone 200-200-20 MG/5ML Susp Commonly known as: MAALOX Take 30 mLs by mouth daily as needed (indigestion). What changed: when to take this   buPROPion  150 MG 24 hr tablet Commonly known as: WELLBUTRIN  XL Take 150 mg by mouth daily.   cetirizine-pseudoephedrine 5-120 MG tablet Commonly known  as: ZYRTEC-D Take 1 tablet by mouth 2 (two) times daily as needed for allergies.   Co Q-10 300 MG Caps Take 300 mg by mouth daily.   DHEA 50 50 MG Caps Generic drug: Prasterone (DHEA) Take 50 mg by mouth daily.   famotidine  20 MG tablet Commonly known as: Pepcid  One after bfast and one  after supper   fluticasone  50 MCG/ACT nasal spray Commonly known as: FLONASE  Place 2 sprays into both nostrils daily.   levothyroxine  112 MCG tablet Commonly known as: SYNTHROID  Take 112 mcg by mouth daily before breakfast.   LORazepam  0.5 MG tablet Commonly known as: ATIVAN  Take 0.5 mg by mouth at bedtime.   losartan  50 MG tablet Commonly known as: COZAAR  Take 50 mg by mouth daily.   Magnesium 200 MG Tabs Take 200 mg by mouth daily.   metoprolol  tartrate 25 MG tablet Commonly known as: LOPRESSOR  TAKE 1 TABLET BY MOUTH TWICE A DAY   Mucinex  DM Maximum Strength 60-1200 MG Tb12 Take 1 tablet by mouth 2 (two) times daily as needed (congestion).   Omega-3 300 MG Caps Take 300 mg by mouth daily.   oxymetazoline 0.05 % nasal spray Commonly known as: AFRIN Place 1 spray into both nostrils daily as needed for congestion.   Ozempic (0.25 or 0.5 MG/DOSE) 2 MG/1.5ML Sopn Generic drug: Semaglutide(0.25 or 0.5MG /DOS) Inject 0.25 mg into the skin every Friday.   spironolactone  50 MG tablet Commonly known as: ALDACTONE  Take 50 mg by mouth daily.   SUPER B COMPLEX PO Take 1 capsule by mouth daily.   venlafaxine  25 MG tablet Commonly known as: EFFEXOR  Take 25 mg by mouth daily.   Vitamin D 50 MCG (2000 UT) tablet Take 4,000 Units by mouth daily.   vitamin E 180 MG (400 UNITS) capsule Take 400 Units by mouth daily.        Birth History: non-contributory  Developmental History: non-contributory  Past Surgical History: Past Surgical History:  Procedure Laterality Date   ABDOMINAL HYSTERECTOMY     CATARACT EXTRACTION Bilateral    w/ IOL   SINOSCOPY     TOTAL KNEE  ARTHROPLASTY Left 02/15/2023   Procedure: TOTAL KNEE ARTHROPLASTY;  Surgeon: Rubie Kemps, MD;  Location: [facility];  Service: Orthopedics;  Laterality: Left;     Family History: Family History  Problem Relation Age of Onset   Asthma Mother    Stroke Father    Diabetes Father    Asthma Sister      Social History: Kenita lives at home with her family.  She lives in a house that was built in 1996.  There is wood and tile in the main living areas and area rugs in the bedroom.  They have a heat pump for heating and cooling.  There is a golden  retriever as well as a 3-month-old puppy and very old cats in the home.  There are no dust mite covers on the bedding.  There is no vaping or cigarette exposure.  She is retired.  She takes care of 2 children.  There is no fume, chemical, or dust exposure.  She does have a HEPA filter in her home.  She does not live near an interstate or industrial area.   Review of systems otherwise negative other than that mentioned in the HPI.    Objective:   Blood pressure 112/70, pulse 100, temperature (!) 97.1 F (36.2 C), temperature source Temporal, resp. rate 18, height 5' 0.24 (1.53 m), weight 221 lb 6.4 oz (100.4 kg), SpO2 97%. Body mass index is 42.9 kg/m.     Physical Exam Vitals reviewed.  Constitutional:      Appearance: She is well-developed.     Comments: Talkative. Cooperative with the exam. Friendly.   HENT:     Head: Normocephalic and atraumatic.     Right Ear: Tympanic membrane, ear canal and external ear normal. No drainage, swelling or tenderness. Tympanic membrane is not injected, scarred, erythematous, retracted or bulging.     Left Ear: Tympanic membrane, ear canal and external ear normal. No drainage, swelling or tenderness. Tympanic membrane is not injected, scarred, erythematous, retracted or bulging.     Nose: Mucosal edema and rhinorrhea present. No nasal deformity or septal deviation.     Right Turbinates: Enlarged and  swollen.     Left Turbinates: Enlarged and swollen.     Right Sinus: No maxillary sinus tenderness or frontal sinus tenderness.     Left Sinus: No maxillary sinus tenderness or frontal sinus tenderness.     Mouth/Throat:     Mouth: Mucous membranes are not pale and not dry.     Pharynx: Uvula midline.   Eyes:     General:        Right eye: No discharge.        Left eye: No discharge.     Conjunctiva/sclera: Conjunctivae normal.     Right eye: Right conjunctiva is not injected. No chemosis.    Left eye: Left conjunctiva is not injected. No chemosis.    Pupils: Pupils are equal, round, and reactive to light.    Cardiovascular:     Rate and Rhythm: Normal rate and regular rhythm.     Heart sounds: Normal heart sounds.  Pulmonary:     Effort: Pulmonary effort is normal. No tachypnea, accessory muscle usage or respiratory distress.     Breath sounds: Normal breath sounds. No wheezing, rhonchi or rales.  Chest:     Chest wall: No tenderness.  Abdominal:     Tenderness: There is no abdominal tenderness. There is no guarding or rebound.  Lymphadenopathy:     Head:     Right side of head: No submandibular, tonsillar or occipital adenopathy.     Left side of head: No submandibular, tonsillar or occipital adenopathy.     Cervical: No cervical adenopathy.   Skin:    Coloration: Skin is not pale.     Findings: No abrasion, erythema, petechiae or rash. Rash is not papular, urticarial or vesicular.   Neurological:     Mental Status: She is alert.   Psychiatric:        Behavior: Behavior is cooperative.      Diagnostic studies:  labs sent to look at her immune system  Spirometry: Normal FEV1, FVC, and FEV1/FVC ratio. There  is no scooping suggestive of obstructive disease.     Spirometry consistent with normal pattern.   Allergy Studies: deferred due to insurance stipulations that require a separate visit for testing          Marty Shaggy, MD [facility] 

## 2024-03-09 NOTE — Patient Instructions (Addendum)
 1. Moderate persistent asthma, uncomplicated - Lung testing looked great today. - I think you would benefit from Fasenra, which would decrease your number of times you need steroids and could improve your lung function. [facility] handout provided today.  - Daily controller medication(s): Symbicort  80/4.59mcg two puffs twice daily with spacer - Prior to physical activity: albuterol  2 puffs 10-15 minutes before physical activity. - Rescue medications: albuterol  4 puffs every 4-6 hours as needed and albuterol  nebulizer one vial every 4-6 hours as needed - Asthma control goals:  * Full participation in all desired activities (may need albuterol  before activity) * Albuterol  use two time or less a week on average (not counting use with activity) * Cough interfering with sleep two time or less a month * Oral steroids no more than once a year * No hospitalizations  2. Chronic rhinitis - Because of insurance stipulations, we cannot do skin testing on the same day as your first visit. - We are all working to fight this, but for now we need to do two separate visits.  - We will know more after we do testing at the next visit.  - The skin testing visit can be squeezed in at your convenience.  - Then we can make a more full plan to address all of your symptoms. - Be sure to stop your antihistamines for 3 days before this appointment.  - You can continue to use Nasonex  and Flonase .   3. Recurrent infections - We will obtain some screening labs to evaluate your immune system.  - Labs to evaluate the quantitative [facility]) aspects of your immune system: IgG/IgA/IgM, CBC with differential - Labs to evaluate the qualitative (HOW WELL THEY WORK) aspects of your immune system: CH50, Pneumococcal titers, Tetanus titers, Diphtheria titers - We may consider immunizations with Pneumovax and Tdap to challenge your immune system, and then obtain repeat titers in 4-6 weeks.   4. Return in about 1 week (around  03/16/2024) for SKIN TESTING (1-55). You can have the follow up appointment with Dr. Iva or a Nurse Practicioner (our Nurse Practitioners are excellent and always have Physician oversight!).    Please inform us  of any Emergency Department visits, hospitalizations, or changes in symptoms. Call us  before going to the ED for breathing or allergy symptoms since we might be able to fit you in for a sick visit. Feel free to contact us  anytime with any questions, problems, or concerns.  It was a pleasure to meet you today!  Websites that have reliable patient information: 1. American Academy of Asthma, Allergy, and Immunology: www.aaaai.org 2. Food Allergy Research and Education (FARE): foodallergy.org 3. Mothers of Asthmatics: http://www.asthmacommunitynetwork.org 4. American College of Allergy, Asthma, and Immunology: www.acaai.org      "Like" us  on Facebook and Instagram for our latest updates!      A healthy democracy works best when [employer] participate! Make sure you are registered to vote! If you have moved or changed any of your contact information, you will need to get this updated before voting! Scan the QR codes below to learn more!

## 2024-03-16 ENCOUNTER — Ambulatory Visit: Payer: Self-pay | Admitting: Allergy & Immunology

## 2024-03-16 LAB — STREP PNEUMONIAE 23 SEROTYPES IGG
Pneumo Ab Type 1*: 2.8 ug/mL (ref >1.3)
Pneumo Ab Type 12 (12F)*: 0.6 ug/mL — AB (ref >1.3)
Pneumo Ab Type 14*: 2.6 ug/mL (ref >1.3)
Pneumo Ab Type 17 (17F)*: 5.4 ug/mL (ref >1.3)
Pneumo Ab Type 19 (19F)*: 4.2 ug/mL (ref >1.3)
Pneumo Ab Type 2*: 4.2 ug/mL (ref >1.3)
Pneumo Ab Type 20*: 1.9 ug/mL (ref >1.3)
Pneumo Ab Type 22 (22F)*: 0.2 ug/mL — AB (ref >1.3)
Pneumo Ab Type 23 (23F)*: 1 ug/mL — AB (ref >1.3)
Pneumo Ab Type 26 (6B)*: 1.2 ug/mL — AB (ref >1.3)
Pneumo Ab Type 3*: 0.2 ug/mL — AB (ref >1.3)
Pneumo Ab Type 34 (10A)*: 0.9 ug/mL — AB (ref >1.3)
Pneumo Ab Type 4*: 0.8 ug/mL — AB (ref >1.3)
Pneumo Ab Type 43 (11A)*: 0.6 ug/mL — AB (ref >1.3)
Pneumo Ab Type 5*: 1.1 ug/mL — AB (ref >1.3)
Pneumo Ab Type 51 (7F)*: 4.6 ug/mL (ref >1.3)
Pneumo Ab Type 54 (15B)*: 5.1 ug/mL (ref >1.3)
Pneumo Ab Type 56 (18C)*: 6.7 ug/mL (ref >1.3)
Pneumo Ab Type 57 (19A)*: 3 ug/mL (ref >1.3)
Pneumo Ab Type 68 (9V)*: 2.5 ug/mL (ref >1.3)
Pneumo Ab Type 70 (33F)*: 8.8 ug/mL (ref >1.3)
Pneumo Ab Type 8*: 3 ug/mL (ref >1.3)
Pneumo Ab Type 9 (9N)*: 1.8 ug/mL (ref >1.3)

## 2024-03-16 LAB — CBC WITH DIFF/PLATELET
Basophils Absolute: 0 10*3/uL (ref 0.0–0.2)
Basos: 1 %
EOS (ABSOLUTE): 0.3 10*3/uL (ref 0.0–0.4)
Eos: 3 %
Hematocrit: 37.2 % (ref 34.0–46.6)
Hemoglobin: 12.3 g/dL (ref 11.1–15.9)
Immature Grans (Abs): 0 10*3/uL (ref 0.0–0.1)
Immature Granulocytes: 0 %
Lymphocytes Absolute: 1.8 10*3/uL (ref 0.7–3.1)
Lymphs: 23 %
MCH: 30.3 pg (ref 26.6–33.0)
MCHC: 33.1 g/dL (ref 31.5–35.7)
MCV: 92 fL (ref 79–97)
Monocytes Absolute: 0.6 10*3/uL (ref 0.1–0.9)
Monocytes: 7 %
Neutrophils Absolute: 4.9 10*3/uL (ref 1.4–7.0)
Neutrophils: 66 %
Platelets: 250 10*3/uL (ref 150–450)
RBC: 4.06 x10E6/uL (ref 3.77–5.28)
RDW: 12.8 % (ref 11.7–15.4)
WBC: 7.6 10*3/uL (ref 3.4–10.8)

## 2024-03-16 LAB — DIPHTHERIA / TETANUS ANTIBODY PANEL
Diphtheria Ab: 0.16 [IU]/mL (ref <0.10)
Tetanus Ab, IgG: 0.59 [IU]/mL (ref <0.10)

## 2024-03-16 LAB — IGG, IGA, IGM
IgA/Immunoglobulin A, Serum: 79 mg/dL (ref 64–422)
IgG (Immunoglobin G), Serum: 543 mg/dL — ABNORMAL LOW (ref 586–1602)
IgM (Immunoglobulin M), Srm: 137 mg/dL (ref 26–217)

## 2024-03-16 LAB — COMPLEMENT, TOTAL: Compl, Total (CH50): 59 U/mL (ref >41)

## 2024-03-20 NOTE — Progress Notes (Unsigned)
 Jeanne Ortiz, female    DOB: [birth year]    MRN: [id number]   Brief patient profile:  64   yowf never smoker onset in teenager years in [place] with intermittent non-seasonal rhinitis more problem around age 83 with year round symptoms no better with sinus surgeries / allergy eval pos birch, horses cats/dog never took shots  with new onset cough/wheezing  around 2010 eval by [facility] rx alb/antihistamines then he left for [place] [facility]  and then seen  By [facility]  rec gerd rx didn't help and side effects = crampy  then nucala did not take due to cost and worse cough/sob since arrived in [facility] March 2020 so referred to pulmonary clinic 11/16/2019 by Elberta Cone  NP.  Living in house built in 1980s with house with crawl space/ 2 cats / golden retriever    History of Present Illness  11/16/2019  Pulmonary/ 1st office eval/Jeanne Ortiz / maint on singulair  and generic advair Chief Complaint  Patient presents with   Pulmonary Consult    Referred by Elberta Cone, NP. Pt c/o cough for the past 8-10 years.   Dyspnea:  MMRC2 = can't walk a nl pace on a flat grade s sob but does fine slow and flat leaning on cart at HT Cough: dry raspy after supper and hs  Sleep: flat bed 2 pillows  SABA use: daily now but up to 7x per day much less since last prednisone  finished  4 days  prior to OV   Has overt HB but told can't take ppi due to renal dz rec .Plan A = Automatic = Always=    Symbicort  80 Take 2 puffs first thing in am and then another 2 puffs about 12 hours later.  Work on inhaler technique:   Pepcid  20 mg one after bfast and supper  Plan B = Backup (to supplement plan A, not to replace it) Only use your albuterol (poair)  inhaler as a rescue medication Plan C = Crisis (instead of Plan B but only if Plan B stops working) - only use your albuterol  nebulizer if you first try Plan B  Plan D = Deltasone  - if getting worse despite above then take Prednisone  10 mg take  4 each am  x 2 days,   2 each am x 2 days,  1 each am x 2 days and stop     08/13/2022 acute  ov/Jeanne Ortiz re: asthma  maint on symbicort  160   Chief Complaint  Patient presents with   Acute Visit    Pneumonia at Urgent care 1wk. Ago, sob, cough-pale yellow now, no fever  Dyspnea:  was doing fine until acutely worse Jul 03 2022 rx by uc with zpak/ pred/ amox and some better but chest still feels tight , better p saba  Cough: very pale now just in am  Sleeping: no resp cc flat bed 2 pillows  SABA use: 2-3 x per day  02: none Rec Plan A = Automatic = Always=    Symbicort  160 or dulera  200 Take 2 puffs first thing in am and then another 2 puffs about 12 hours later.  Plan B = Backup (to supplement plan A, not to replace it) Only use your albuterol  inhaler as a rescue medication  Also  Ok to try albuterol  15 min before an activity (on alternating days)  that you know would usually make you short of breath . Depomedrol 120 mg IM today  For cough > mucinex  dm 1200  mg every 12 hours as needed  Try prilosec otc 20mg   Take 30-60 min before first meal of the day and Pepcid  ac (famotidine ) 20 mg one @  bedtime until cough is completely gone for at least a week without the need for cough suppression GERD diet reviewed, bed blocks rec     01/24/2024  f/u ov/Jeanne Ortiz re: asthma    maint on generic Symbicort   160 Take 2 puffs first thing in am and then another 2 puffs about 12 hours later.  Chief Complaint  Patient presents with   Follow-up    Allergies are bothering her.  Breathing is a little better than last visit.  Dyspnea:  limited by back /radicular  L 5 on Left Cough: intermittent green mucus/ rattling x sev weeks every spring  Sleeping: flat bed 2 pillows more cough x last  2 nocts  SABA use: once or twice a day  Rec Depomedrol 120  mg IM  Work on inhaler technique:   My office will be contacting you by phone for referral to Allergy [facility]    03/21/2024  f/u ov/Jeanne Ortiz re: cough variant asthma  maint  on symbicort  80/ no longer on singulair / f/u by Iva for trial of   Chief Complaint  Patient presents with   Follow-up    Cough Variant Asthma   Dyspnea:  limited by back / leg / hot weather  Cough: pnds worse off antihistamines for testing  Sleeping: flat bed 2 pllows s resp cc  SABA use: 3 x a week 02: none    No obvious day to day or daytime variability or assoc excess/ purulent sputum or mucus plugs or hemoptysis or cp or chest tightness, subjective wheeze or overt sinus or hb symptoms.    Also denies any obvious fluctuation of symptoms with weather or environmental changes or other aggravating or alleviating factors except as outlined above   No unusual exposure hx or h/o childhood pna/ asthma or knowledge of premature birth.  Current Allergies, Complete Past Medical History, Past Surgical History, Family History, and Social History were reviewed in [employer] record.  ROS  The following are not active complaints unless bolded Hoarseness, sore throat, dysphagia, dental problems, itching, sneezing,  nasal congestion or discharge of excess mucus or purulent secretions, ear ache,   fever, chills, sweats, unintended wt loss or wt gain, classically pleuritic or exertional cp,  orthopnea pnd or arm/hand swelling  or leg swelling, presyncope, palpitations, abdominal pain, anorexia, nausea, vomiting, diarrhea  or change in bowel habits or change in bladder habits, change in stools or change in urine, dysuria, hematuria,  rash, arthralgias, visual complaints, headache, numbness, weakness or ataxia or problems with walking or coordination,  change in mood or  memory.        Current Meds -  - NOTE:   Unable to verify as accurately reflecting what pt takes    Medication Sig   acetaminophen  (TYLENOL ) 500 MG tablet Take 1,000 mg by mouth every 6 (six) hours as needed for moderate pain.   albuterol  (PROAIR  HFA) 108 (90 Base) MCG/ACT inhaler Inhale 2 puffs into the lungs  every 4 (four) hours as needed.   aluminum-magnesium hydroxide-simethicone (MAALOX) 200-200-20 MG/5ML SUSP Take 30 mLs by mouth daily as needed (indigestion). (Patient taking differently: Take 30 mLs by mouth 2 (two) times daily.)   B Complex-C (SUPER B COMPLEX PO) Take 1 capsule by mouth daily.   buPROPion  (WELLBUTRIN  XL) 150 MG 24 hr tablet  Take 150 mg by mouth daily.   cetirizine-pseudoephedrine (ZYRTEC-D) 5-120 MG tablet Take 1 tablet by mouth 2 (two) times daily as needed for allergies.   Cholecalciferol (VITAMIN D) 50 MCG (2000 UT) tablet Take 4,000 Units by mouth daily.   Coenzyme Q10 (CO Q-10) 300 MG CAPS Take 300 mg by mouth daily.   Dextromethorphan-guaiFENesin  (MUCINEX  DM MAXIMUM STRENGTH) 60-1200 MG TB12 Take 1 tablet by mouth 2 (two) times daily as needed (congestion).   famotidine  (PEPCID ) 20 MG tablet One after bfast and one  after supper   fluticasone  (FLONASE ) 50 MCG/ACT nasal spray Place 2 sprays into both nostrils daily.   levothyroxine  (SYNTHROID ) 112 MCG tablet Take 112 mcg by mouth daily before breakfast.   LORazepam  (ATIVAN ) 0.5 MG tablet Take 0.5 mg by mouth at bedtime.   losartan  (COZAAR ) 50 MG tablet Take 50 mg by mouth daily.   Magnesium 200 MG TABS Take 200 mg by mouth daily.   metoprolol  tartrate (LOPRESSOR ) 25 MG tablet TAKE 1 TABLET BY MOUTH TWICE A DAY   Omega-3 300 MG CAPS Take 300 mg by mouth daily.   oxymetazoline (AFRIN) 0.05 % nasal spray Place 1 spray into both nostrils daily as needed for congestion.   Prasterone, DHEA, (DHEA 50) 50 MG CAPS Take 50 mg by mouth daily.   Semaglutide,0.25 or 0.5MG /DOS, (OZEMPIC, 0.25 OR 0.5 MG/DOSE,) 2 MG/1.5ML SOPN Inject 0.25 mg into the skin every Friday.   spironolactone  (ALDACTONE ) 50 MG tablet Take 50 mg by mouth daily.   venlafaxine  (EFFEXOR ) 25 MG tablet Take 25 mg by mouth daily.   vitamin E 180 MG (400 UNITS) capsule Take 400 Units by mouth daily.     Still on symbicort   160 2bid        Past Medical History:   Diagnosis Date   Asthma    Hypertension    Thyroid disease        Objective:   Wts   03/21/2024         223   01/24/2024       221  08/26/2022     219  08/13/2022     222   09/16/2021         224   06/09/2021      225   05/31/20 213 lb (96.6 kg)  01/26/20 217 lb (98.4 kg)  11/16/19 220 lb (99.8 kg)  Vital signs reviewed  03/21/2024  - Note at rest 02 sats  98% on RA   General appearance:    amb slt hoarse  wf nad / occ throat clearing   HEENT : Oropharynx  clear   NECK :  without  apparent JVD/ palpable Nodes/TM    LUNGS: no acc muscle use,  Nl contour chest which is clear to A and P bilaterally without cough on insp or exp maneuvers   CV:  RRR  no s3 or murmur or increase in P2, and no edema   ABD:  soft and nontender   MS:  Gait nl   ext warm without deformities Or obvious joint restrictions  calf tenderness, cyanosis or clubbing    SKIN: warm and dry without lesions    NEURO:  alert, approp, nl sensorium with  no motor or cerebellar deficits apparent.        Assessment

## 2024-03-21 ENCOUNTER — Ambulatory Visit: Admitting: Internal Medicine

## 2024-03-21 ENCOUNTER — Encounter: Payer: Self-pay | Admitting: Internal Medicine

## 2024-03-21 VITALS — BP 126/72 | HR 86 | Temp 97.9°F | Ht 61.0 in | Wt 223.2 lb

## 2024-03-21 DIAGNOSIS — J45991 Cough variant asthma: Secondary | ICD-10-CM | POA: Diagnosis not present

## 2024-03-21 NOTE — Patient Instructions (Incomplete)
 Allergic rhinitis Your skin testing  Recurrent infection Continue to track infections, antibiotic use, and steroid use  Asthma Continue albuterol  2 puffs once every 4 hours if needed for cough or wheeze You may use albuterol  2 puffs 5 to 15 minutes before activity to decrease cough or wheeze.  She is already taking 6. Continue to follow-up with Dr. Darlean, pulmonology specialist, as recommended  Call the clinic if this treatment plan is not working well for you  Follow up in *** or sooner if needed.

## 2024-03-21 NOTE — Patient Instructions (Signed)
If you are satisfied with your treatment plan,  let your doctor know and he/she can either refill your medications or you can return here when your prescription runs out.     If in any way you are not 100% satisfied,  please tell us.  If 100% better, tell your friends!  Pulmonary follow up is as needed

## 2024-03-21 NOTE — Assessment & Plan Note (Addendum)
 Onset around 2010 in pt with allergic rhinitis - allergy testing ? 2010  in [place]  Pos multiple sources including dogs / cat / trees  - 11/16/2019   try symbicort  80 2bid instead of wixella (DPI) > much better - 01/26/2020  After extensive coaching inhaler device,  effectiveness =    90% from baseline 75% - PFT's  05/31/2020  FEV1 1.96 (109 % ) ratio 0.78  p 9 % improvement from saba p symbicort  80 x 2 puffs  prior to study with DLCO  23.79 (135%) corrects to 4.85 (116%)  for alv volume and FV curve min concave   - 09/16/2021  After extensive coaching inhaler device,  effectiveness =    80% > try symbicort  160 2bid x one month to see if any better vs symb 80 > prefers the 160  -  01/24/2024  After extensive coaching inhaler device,  effectiveness =    60% (short ti) > continue sybmicort 160 2bid thru spacer if bothering throat despite rinse/gargle  -  03/2024 considering fosenra per Dr Iva  Pulmonary f/u can be prn going forward  Each maintenance medication was reviewed in detail including emphasizing most importantly the difference between maintenance and prns and under what circumstances the prns are to be triggered using an action plan format where appropriate.  Total time for H and P, chart review, counseling, reviewing hfa / spacer  device(s) and generating customized AVS unique to this office visit / same day charting = 22 min final summary f/u ov

## 2024-03-21 NOTE — Progress Notes (Unsigned)
   [address] [place] [address] Dept: [phone]  FOLLOW UP NOTE  Patient ID: Jeanne Ortiz, female    DOB: 1941-06-18  Age: 83 y.o. MRN: [id number] Date of Office Visit: 03/22/2024  Assessment  Chief Complaint: No chief complaint on file.  HPI Jeanne Ortiz is an 83 year old female who presents to clinic for follow-up visit.  She was last seen in this clinic on 03/09/2024 by Dr. Iva as a new patient for evaluation of asthma, chronic rhinitis, and recurrent infection.  Lab work on 03/09/2024 indicated largely normal immune workup with IgG slightly reduced at 543.   Discussed the use of AI scribe software for clinical note transcription with the patient, who gave verbal consent to proceed.  History of Present Illness      Drug Allergies:  Allergies  Allergen Reactions   Fentanyl  And Related Nausea And Vomiting    Severe nausea and vomiting and severe migraine headache.   Morphine And Codeine Nausea And Vomiting    ANY NARCOTIC causes severe nausea and vomiting and severe migraine headache.   Other     brazile and macadamia nuts - migraines    Propofol  Nausea And Vomiting   Sulfa Antibiotics Hives and Swelling    Physical Exam: There were no vitals taken for this visit.   Physical Exam  Diagnostics:    Assessment and Plan: No diagnosis found.  No orders of the defined types were placed in this encounter.   There are no Patient Instructions on file for this visit.  No follow-ups on file.    Thank you for the opportunity to care for this patient.  Please do not hesitate to contact me with questions.  Arlean Mutter, FNP [facility]

## 2024-03-22 ENCOUNTER — Ambulatory Visit: Admitting: Family Medicine

## 2024-03-22 ENCOUNTER — Encounter: Payer: Self-pay | Admitting: Family Medicine

## 2024-03-22 DIAGNOSIS — B999 Unspecified infectious disease: Secondary | ICD-10-CM | POA: Insufficient documentation

## 2024-03-22 DIAGNOSIS — J310 Chronic rhinitis: Secondary | ICD-10-CM

## 2024-03-22 DIAGNOSIS — J4540 Moderate persistent asthma, uncomplicated: Secondary | ICD-10-CM | POA: Insufficient documentation

## 2024-03-23 ENCOUNTER — Telehealth: Payer: Self-pay | Admitting: *Deleted

## 2024-03-23 NOTE — Telephone Encounter (Signed)
-----   Message from Marty Morton Shaggy sent at 03/09/2024  1:09 PM EDT ----- Possible fasenra start

## 2024-03-23 NOTE — Telephone Encounter (Signed)
 L/m for patient to contact me to discuss Jeanne Ortiz

## 2024-03-24 ENCOUNTER — Ambulatory Visit: Payer: Self-pay | Admitting: Family Medicine

## 2024-03-24 LAB — IGE+ALLERGENS ZONE 2(30)

## 2024-03-24 NOTE — Progress Notes (Signed)
 Can you please let this patient know that her environmental allergy testing via lab was negative to the environmental panel.  Continue with the plan as outlined in the after visit summary.  Thank you

## 2024-04-07 ENCOUNTER — Other Ambulatory Visit: Payer: Self-pay | Admitting: Nurse Practitioner

## 2024-04-07 DIAGNOSIS — J302 Other seasonal allergic rhinitis: Secondary | ICD-10-CM

## 2024-07-10 ENCOUNTER — Other Ambulatory Visit: Payer: Self-pay | Admitting: Nurse Practitioner

## 2024-07-10 DIAGNOSIS — J302 Other seasonal allergic rhinitis: Secondary | ICD-10-CM

## 2024-08-25 ENCOUNTER — Telehealth: Payer: Self-pay

## 2024-08-25 ENCOUNTER — Other Ambulatory Visit: Payer: Self-pay

## 2024-08-25 MED ORDER — BUDESONIDE-FORMOTEROL FUMARATE 160-4.5 MCG/ACT IN AERO: 2.0000 | INHALATION_SPRAY | Freq: Two times a day (BID) | RESPIRATORY_TRACT | 3 refills | Status: AC

## 2024-08-25 NOTE — Telephone Encounter (Addendum)
 Copied from CRM [id number]. Topic: Clinical - Prescription Issue >> Aug 21, 2024  1:29 PM Rilla B wrote: Reason for CRM: Patient calling in to say her pharmacy has been trying to reach Dr Darlean to reorder Budesonide  (generic for Symbicort ). I do not see in the list of medication. Please call patient @ [phone].     ----------------------------------------------------------------------- From previous Reason for Contact - Medication Refill: Medication:   Has the patient contacted their pharmacy?   (Agent: If no, request that the patient contact the pharmacy for the refill. If patient does not wish to contact the pharmacy document the reason why and proceed with request.) (Agent: If yes, when and what did the pharmacy advise?)  This is the patient's preferred pharmacy:  [id number] - [place], [place] - [address] AT [address] OF [facility] [address] [place] [address] Phone: [phone] Fax: [phone]  Is this the correct pharmacy for this prescription?   If no, delete pharmacy and type the correct one.   Has the prescription been filled recently?    Is the patient out of the medication?    Has the patient been seen for an appointment in the last year OR does the patient have an upcoming appointment?    Can we respond through MyChart?    Agent: Please be advised that Rx refills may take up to 3 business days. We ask that you follow-up with your pharmacy.

## 2024-08-25 NOTE — Telephone Encounter (Signed)
 Left message on patients VM.  Per chart note from last OV on 03/21/2024:  Still on symbicort  160 2bid.  Symbicort  is Budesonide  and formoterol  combination.

## 2024-08-25 NOTE — Telephone Encounter (Signed)
 Copied from CRM [id number]. Topic: Clinical - Prescription Issue >> Aug 21, 2024  1:29 PM Rilla B wrote: Reason for CRM: Patient calling in to say her pharmacy has been trying to reach Dr Darlean to reorder Budesonide  (generic for Symbicort ). I do not see in the list of medication. Please call patient @ [phone].     ----------------------------------------------------------------------- From previous Reason for Contact - Medication Refill: Medication:   Has the patient contacted their pharmacy?   (Agent: If no, request that the patient contact the pharmacy for the refill. If patient does not wish to contact the pharmacy document the reason why and proceed with request.) (Agent: If yes, when and what did the pharmacy advise?)  This is the patient's preferred pharmacy:  [id number] - [place], [place] - [address] AT [address] OF [facility] [address] [place] [address] Phone: [phone] Fax: [phone]  Is this the correct pharmacy for this prescription?   If no, delete pharmacy and type the correct one.   Has the prescription been filled recently?    Is the patient out of the medication?    Has the patient been seen for an appointment in the last year OR does the patient have an upcoming appointment?    Can we respond through MyChart?    Agent: Please be advised that Rx refills may take up to 3 business days. We ask that you follow-up with your pharmacy. >> Aug 25, 2024 11:30 AM Joesph PARAS wrote: Informed patient that nurse was calling to inform of medication, patient would like a refill of this and does not need a c/b unless clinical staff have questions or need to speak to her.          Tried to reach out to patient VM/LM - (Rx has been sent to pharmacy )

## 2024-08-29 NOTE — Telephone Encounter (Signed)
 I called and spoke with patient, she was able to get her inhaler.  She has a cough, but will call if she needs anything.  Nothing further needed.
